# Patient Record
Sex: Female | Born: 1975 | Race: Black or African American | Hispanic: No | Marital: Single | Smoking: Current every day smoker
Health system: Southern US, Community
[De-identification: ages and names within clinical notes are randomized; demographics above are authoritative.]

## PROBLEM LIST (undated history)

## (undated) ENCOUNTER — Emergency Department (HOSPITAL_COMMUNITY)

## (undated) ENCOUNTER — Inpatient Hospital Stay (HOSPITAL_COMMUNITY): Payer: Self-pay

## (undated) DIAGNOSIS — T4145XA Adverse effect of unspecified anesthetic, initial encounter: Secondary | ICD-10-CM

## (undated) DIAGNOSIS — Z789 Other specified health status: Secondary | ICD-10-CM

## (undated) DIAGNOSIS — T8859XA Other complications of anesthesia, initial encounter: Secondary | ICD-10-CM

---

## 1997-05-26 ENCOUNTER — Emergency Department (HOSPITAL_COMMUNITY): Admission: EM | Admit: 1997-05-26 | Discharge: 1997-05-26 | Payer: Self-pay | Admitting: Emergency Medicine

## 2001-08-09 ENCOUNTER — Emergency Department (HOSPITAL_COMMUNITY): Admission: EM | Admit: 2001-08-09 | Discharge: 2001-08-09 | Payer: Self-pay | Admitting: Emergency Medicine

## 2002-03-17 ENCOUNTER — Ambulatory Visit (HOSPITAL_COMMUNITY): Admission: RE | Admit: 2002-03-17 | Discharge: 2002-03-17 | Payer: Self-pay | Admitting: *Deleted

## 2002-05-18 ENCOUNTER — Ambulatory Visit (HOSPITAL_COMMUNITY): Admission: RE | Admit: 2002-05-18 | Discharge: 2002-05-18 | Payer: Self-pay | Admitting: *Deleted

## 2002-06-25 ENCOUNTER — Ambulatory Visit (HOSPITAL_COMMUNITY): Admission: RE | Admit: 2002-06-25 | Discharge: 2002-06-25 | Payer: Self-pay | Admitting: *Deleted

## 2002-09-01 ENCOUNTER — Ambulatory Visit (HOSPITAL_COMMUNITY): Admission: RE | Admit: 2002-09-01 | Discharge: 2002-09-01 | Payer: Self-pay | Admitting: *Deleted

## 2002-09-21 ENCOUNTER — Encounter (INDEPENDENT_AMBULATORY_CARE_PROVIDER_SITE_OTHER): Payer: Self-pay | Admitting: Specialist

## 2002-09-21 ENCOUNTER — Inpatient Hospital Stay (HOSPITAL_COMMUNITY): Admission: AD | Admit: 2002-09-21 | Discharge: 2002-09-24 | Payer: Self-pay | Admitting: Family Medicine

## 2002-09-30 ENCOUNTER — Inpatient Hospital Stay (HOSPITAL_COMMUNITY): Admission: AD | Admit: 2002-09-30 | Discharge: 2002-09-30 | Payer: Self-pay | Admitting: Obstetrics and Gynecology

## 2005-03-15 ENCOUNTER — Emergency Department (HOSPITAL_COMMUNITY): Admission: EM | Admit: 2005-03-15 | Discharge: 2005-03-15 | Payer: Self-pay | Admitting: Emergency Medicine

## 2005-10-12 ENCOUNTER — Emergency Department (HOSPITAL_COMMUNITY): Admission: EM | Admit: 2005-10-12 | Discharge: 2005-10-12 | Payer: Self-pay | Admitting: Family Medicine

## 2005-12-05 ENCOUNTER — Inpatient Hospital Stay (HOSPITAL_COMMUNITY): Admission: AD | Admit: 2005-12-05 | Discharge: 2005-12-08 | Payer: Self-pay | Admitting: Obstetrics

## 2006-01-14 ENCOUNTER — Emergency Department (HOSPITAL_COMMUNITY): Admission: EM | Admit: 2006-01-14 | Discharge: 2006-01-14 | Payer: Self-pay | Admitting: Emergency Medicine

## 2006-06-25 ENCOUNTER — Emergency Department (HOSPITAL_COMMUNITY): Admission: EM | Admit: 2006-06-25 | Discharge: 2006-06-25 | Payer: Self-pay | Admitting: Emergency Medicine

## 2007-04-15 ENCOUNTER — Emergency Department (HOSPITAL_COMMUNITY): Admission: EM | Admit: 2007-04-15 | Discharge: 2007-04-15 | Payer: Self-pay | Admitting: Emergency Medicine

## 2007-06-04 ENCOUNTER — Inpatient Hospital Stay (HOSPITAL_COMMUNITY): Admission: AD | Admit: 2007-06-04 | Discharge: 2007-06-04 | Payer: Self-pay | Admitting: Obstetrics

## 2007-06-23 ENCOUNTER — Inpatient Hospital Stay (HOSPITAL_COMMUNITY): Admission: AD | Admit: 2007-06-23 | Discharge: 2007-06-24 | Payer: Self-pay | Admitting: Obstetrics

## 2007-09-02 ENCOUNTER — Inpatient Hospital Stay (HOSPITAL_COMMUNITY): Admission: RE | Admit: 2007-09-02 | Discharge: 2007-09-02 | Payer: Self-pay | Admitting: Gynecology

## 2007-09-30 ENCOUNTER — Inpatient Hospital Stay (HOSPITAL_COMMUNITY): Admission: RE | Admit: 2007-09-30 | Discharge: 2007-10-02 | Payer: Self-pay | Admitting: Obstetrics

## 2007-09-30 ENCOUNTER — Encounter (INDEPENDENT_AMBULATORY_CARE_PROVIDER_SITE_OTHER): Payer: Self-pay | Admitting: Obstetrics

## 2007-10-14 ENCOUNTER — Inpatient Hospital Stay (HOSPITAL_COMMUNITY): Admission: AD | Admit: 2007-10-14 | Discharge: 2007-10-17 | Payer: Self-pay | Admitting: Obstetrics

## 2008-01-18 ENCOUNTER — Inpatient Hospital Stay (HOSPITAL_COMMUNITY): Admission: AD | Admit: 2008-01-18 | Discharge: 2008-01-18 | Payer: Self-pay | Admitting: Obstetrics & Gynecology

## 2009-11-14 ENCOUNTER — Emergency Department (HOSPITAL_COMMUNITY): Admission: EM | Admit: 2009-11-14 | Discharge: 2009-11-14 | Payer: Self-pay | Admitting: Emergency Medicine

## 2010-04-05 LAB — COMPREHENSIVE METABOLIC PANEL
AST: 27 U/L (ref 0–37)
Albumin: 3.8 g/dL (ref 3.5–5.2)
Alkaline Phosphatase: 43 U/L (ref 39–117)
Chloride: 107 mEq/L (ref 96–112)
Creatinine, Ser: 0.85 mg/dL (ref 0.4–1.2)
GFR calc Af Amer: >60 mL/min (ref >60)
Potassium: 3.9 mEq/L (ref 3.5–5.1)
Total Bilirubin: 0.5 mg/dL (ref 0.3–1.2)

## 2010-04-05 LAB — DIFFERENTIAL
Basophils Absolute: 0 10*3/uL (ref 0.0–0.1)
Eosinophils Relative: 1 % (ref 0–5)
Lymphocytes Relative: 29 % (ref 12–46)
Monocytes Absolute: 0.5 10*3/uL (ref 0.1–1.0)
Monocytes Relative: 7 % (ref 3–12)

## 2010-04-05 LAB — URINALYSIS, ROUTINE W REFLEX MICROSCOPIC
Nitrite: NEGATIVE
Specific Gravity, Urine: 1.016 (ref 1.005–1.030)
Urobilinogen, UA: 1 mg/dL (ref 0.0–1.0)

## 2010-04-05 LAB — PREGNANCY, URINE: Preg Test, Ur: NEGATIVE

## 2010-04-05 LAB — CBC
Platelets: 192 10*3/uL (ref 150–400)
RBC: 3.53 MIL/uL — ABNORMAL LOW (ref 3.87–5.11)
WBC: 7.9 10*3/uL (ref 4.0–10.5)

## 2010-06-06 NOTE — Op Note (Signed)
NAMESHERESA, CULLOP        ACCOUNT NO.:  [id number]   MEDICAL RECORD NO.:  [id number]          PATIENT TYPE:  INP   LOCATION:  9103                          [facility]ILITY:  [facility]   PHYSICIAN:  Kathreen Cosier, M.D.DATE OF BIRTH:  Jun 08, 1975   DATE OF PROCEDURE:  09/30/2007  DATE OF DISCHARGE:                               OPERATIVE REPORT   PREOPERATIVE DIAGNOSIS:  Intrauterine pregnancy 37+ weeks with a  previously dehisced classical uterine scar.   POSTOPERATIVE DIAGNOSIS:  Intrauterine pregnancy 37+ weeks with a  previously dehisced classical uterine scar.   SURGEON:  Kathreen Cosier, MD   FIRST ASSISTANT:  Charles A. Clearance Coots, MD   ANESTHESIA:  Spinal.   PROCEDURE:  The patient placed on the operating table in supine position  after spinal administered.  Abdomen prepped and draped, bladder emptied  with a Foley catheter.  Midline incision made through the old scar,  carried down to the rectus fascia.  Fascia was cleaned and incised to  the length of the incision.  Recti muscles retracted laterally and then  opening the peritoneum, the placenta could be palpated.  The abdominal  wall incision was opened and it was noted that the previous classical  incision on the uterus was open.  The membranes were visible and the  anterior fundal placenta visible.  Membranes were ruptured.  The patient  delivered from the vertex position of a female Apgar 8 and 9, weighing 6  pounds and 9 ounces.  The placenta was removed manually.  Uterus cleaned  with dry laps.  It was noted that the abdominal wall was thick and  intimately adherent to the anterior abdominal wall.  The anterior  abdominal wall had been acting as anterior wall of the uterus.  The  uterine incision was closed in 2 layers with continuous suture of #1  chromic.  Hemostasis was satisfactory.  The abdominal wall was closed  with continuous suture of #1 PDS.  Blood loss was 600 mL.  The skin was  closed with staples.  The  patient tolerated the procedure well, taken to  recovery room in good condition.           ______________________________  Kathreen Cosier, M.D.     BAM/MEDQ  D:  09/30/2007  T:  10/01/2007  Job:  [id number]

## 2010-06-09 NOTE — Op Note (Signed)
NAMEREAGYN, FACEMIRE        ACCOUNT NO.:  [id number]   MEDICAL RECORD NO.:  [id number]          PATIENT TYPE:  INP   LOCATION:  9130                          [facility]   PHYSICIAN:  Kathreen Cosier, M.D.DATE OF BIRTH:  05-12-1975   DATE OF PROCEDURE:  12/05/2005  DATE OF DISCHARGE:                                 OPERATIVE REPORT   PREOPERATIVE DIAGNOSIS:  Previous cesarean section x2 at term, for elective  repeat.   SURGEON:  Kathreen Cosier, M.D.   FIRST ASSISTANT:  Charles A. Clearance Coots, M.D.   ANESTHESIA:  Spinal.   DESCRIPTION OF PROCEDURE:  The patient placed on the operating table in the  supine position.  After spinal was administered, abdomen prepped and draped.  Bladder emptied with Foley catheter.  Midline incision made through the old  scar, carried down through the rectus fascia.  The fascia was cleaned and  incised the length of the incision.  Rectus muscle were retracted laterally.  Starting at the top of the incision, the per was grasped with two Kellys and  hemostasis was used to open the peritoneum at the top of the incision.  At  that point, the patient started having heavy bleeding and the incision was  opened with a scalpel and there was a large amount of blood present.  She  had had a previous classical incision and the incision was dehisced totally.  The placenta was presenting and this was separated and the membranes  ruptured and fluid clear. She delivered vertex female, Apgars 9 and 9, weight  7 pounds 1 ounce.  The placenta was removed and the uterine cavity was  cleaned with dry laps.  The classical incision was closed in two layers with  interrupted suture of 3-#1 chromic.  Hemostasis was satisfactory.  The  fascia was then closed with interrupted suture of 0 PDS and the skin closed  with skin clips.  Blood loss 700 mL.  The patient tolerated the procedure  well and went to the recovery room in good condition.     ______________________________  Kathreen Cosier, M.D.     BAM/MEDQ  D:  12/05/2005  T:  12/05/2005  Job:  [id number]

## 2010-06-09 NOTE — Discharge Summary (Signed)
April Knight, WEATHERHOLTZ        ACCOUNT NO.:  [id number]   MEDICAL RECORD NO.:  [id number]          PATIENT TYPE:  INP   LOCATION:  9319                          [facility]   PHYSICIAN:  Roseanna Rainbow, M.D.DATE OF BIRTH:  29-May-1975   DATE OF ADMISSION:  10/14/2007  DATE OF DISCHARGE:  10/17/2007                               DISCHARGE SUMMARY   CHIEF COMPLAINT:  The patient is a 35 year old status post a cesarean  delivery on September 30, 2007, complaining of a headache.   HISTORY OF PRESENT ILLNESS:  Please see the above.   PHYSICAL EXAMINATION:  VITAL SIGNS:  Blood pressure 160/102.  HEAD, EYES, EARS, NOSE and THROAT:  Normocephalic, atraumatic.  LUNGS:  Clear to auscultation bilaterally.  BREASTS:  Engorged.  HEART:  Regular rate and rhythm.  No murmurs, rubs, or gallops.  ABDOMEN:  Midline scar with staples.  EXTREMITIES:  Nontender.   LABORATORY DATA:  Uric acid 8.3, SGPT 38.   ASSESSMENT:  Postpartum pregnancy-induced hypertension.   PLAN:  Admission, magnesium sulfate seizure prophylaxis.   HOSPITAL COURSE:  The patient was admitted.  She was started on  magnesium sulfate for seizure prophylaxis.  She was also started on p.o.  labetalol.  The magnesium sulfate was continued for 24 hours.  The  patient's headache resolved.  The remainder of her hospital course was  uneventful.  She was discharged to home on October 17, 2007.   DISCHARGE DIAGNOSIS:  Postpartum pregnancy-induced hypertension.   CONDITION:  Stable.   DIET:  Regular.   ACTIVITY:  Modified bed rest.   DISPOSITION:  The patient was to follow up in the office with Dr.  Gaynell Face in 1-week.   MEDICATIONS:  Continue Percocet and ibuprofen.  She was also given a  prescription for labetalol 1 tablet t.i.d.      Roseanna Rainbow, M.D.  Electronically Signed     LAJ/MEDQ  D:  03/17/2008  T:  03/18/2008  Job:  [id number]   cc:   Kathreen Cosier, M.D.  Fax: [phone]

## 2010-06-09 NOTE — Op Note (Signed)
NAME:  LADONNE, SHARPLES                  ACCOUNT NO.:  [id number]   MEDICAL RECORD NO.:  [id number]                   PATIENT TYPE:  INP   LOCATION:  9119                                 [facility]   PHYSICIAN:  Tanya S. Shawnie Pons, M.D.                DATE OF BIRTH:  [birth year]   DATE OF PROCEDURE:  09/21/2002  DATE OF DISCHARGE:                                 OPERATIVE REPORT   PREOPERATIVE DIAGNOSES:  1. Intrauterine pregnancy at 39 weeks.  2. Left ovarian cyst.  3. Previous cesarean section.   POSTOPERATIVE DIAGNOSES:  1. Intrauterine pregnancy at 39 weeks.  2. Left ovarian cyst.  3. Previous cesarean section.   PROCEDURES:  1. Repeat low transverse cesarean section.  2. Left ovarian cystectomy.  3. Scar revision.  4. Adhesiolysis.   SURGEON:  Shelbie Proctor. Shawnie Pons, M.D.   ASSISTANTMichele Mcalpine D. Okey Dupre, M.D.   ANESTHESIA:  Spinal and sedation by Burnett Corrente, M.D.   FINDINGS:  A viable female infant, Apgars of 9 and 9, weight of 7 pounds 6  ounces.   ESTIMATED BLOOD LOSS:  1000 mL.   COMPLICATIONS:  None.   SPECIMENS:  Ovarian cyst to pathology.   REASON FOR PROCEDURE:  Briefly, the patient is a 35 year old gravida 3, para  1-0-1-1, who is at 51 weeks, who had a previous C-section at 41 weeks for  persistent OP and arrest of descent.  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  The patient also has a history of previous right oophorectomy as a child for  unknown reason.  The patient desired a repeat C-section with ovarian cyst  removal.   DESCRIPTION OF PROCEDURE:  The patient was taken to the OR, where she was  placed in the supine position with a left lateral tilt after spinal  analgesia was administered.  She was then prepped and draped in the usual  sterile fashion, a Foley catheter placed in the bladder.  When anesthesia  was found to be adequate, her vertical midline incision was  incised.  The  scar was fairly wide and elliptical incision was made to excise the scar.  The electrocautery was used to completely dissect the scar out of the  operative field.  When the scar was removed, the fascia was then entered  sharply and the incision extended superiorly and inferiorly.  The midline of  the incision was found and the rectus muscle divided.  The peritoneum and  uterus were found to be strongly adherent to the anterior abdominal wall,  especially around the fundus.  The peritoneum was entered sharply and then  the bladder blade placed.  The peritoneal incision had to be extended  laterally in both directions as well as superiorly until enough room was  made for passage of the infant.  At a point above  the bladder reflection a  knife was used to make an incision on the uterus in a transverse fashion.  This segment, although low transverse, was not thinned out at all and was  very thick.  Two Allis clamps were used in the middle of the incision to  control bleeding until the amniotic cavity could be encountered.  The bag  was ruptured and found to have clear fluid.  The incision in the uterus was  then extended laterally with the bandage scissors and the infant found to be  in vertex position and OP.  The infant's head was directed to OA and with  fundal pressure, the infant's head was delivered.  The baby was bulb-  suctioned, the rest of the body delivered, the cord clamped x2 and the  infant handed to the waiting pediatricians.  There was a spontaneous cry  and, as stated previously, the Apgars were 9 and 9.  The weight was 7 pounds  6 ounces.  Cord blood was then obtained and the placenta delivered from the  uterus without problem.  The uterine cavity was then cleaned with a dry lap  pad and a ring forceps used to open the cervix.  The other rings were used  to find the borders of the uterine incision, clamped, and then the uterine  incision was closed with a #1  Vicryl suture in a locked running fashion.  A  second imbricating layer of a Lembert stitch was then used for further  hemostasis.  The uterine incision appeared to be hemostatic and attention  was turned to the left ovary.  Initially the round was grasped with a  Babcock clamp and attempt was made to deliver the ovary, but this could not  be done.  Omentum was in the way, and a lap pad was used to pack the omentum  away on the left gutter.  Still the ovary could not be brought out, and so  attempt was made to free up the adhesions to the uterine fundus.  Several  layers of peritoneum were taken down and appeared to be grossly adherent to  the uterine wall, and it seemed that muscle was the most thing being  divided.  This was done on the right side first and then on the left side  superiorly until the left ovary was able to be delivered.  A large cyst was  noted on the ovary.  An incision was made with the electrocautery through  the capsule until the cyst could be identified and then was dissected out.  The cyst did rupture and then there was lipid-like fluid spillage and the  signs were consistent with a dermoid.  Once the entire cyst was removed, the  defect in the ovary was closed with a 3-0 Vicryl suture on an SH in two  layers.  The ovary was hemostatic and returned to the peritoneal cavity.  The tube was looked at and found to be also hemostatic.  The right adnexa  was looked at and hemostatic.  The abdomen was then copiously irrigated with  warm fluid and the uterine incision looked at again and found to be  hemostatic.  The only areas of bleeding were noted from the adhesiolysis  sites on the uterine fundus and the peritoneum that had been adjacent to it.  These areas were cauterized with the electrocautery and when hemostatic,  returned to the abdomen.  The superior and inferior edge of the fascia were  then grasped with two  Kocher clamps and the fascia closed in a  running fashion with a looped PDS starting from the top of the incision and then  from the inferior portion of the incision.  Because of the distorted  anatomy, only the fascia was closed with a stitch.  The two sutures were  brought to the midline and tied in the middle.  The subcutaneous tissue was  copiously irrigated and then the skin was closed using staples.  Once the  skin was closed, the incision was infiltrated with 17 mL of 0.25% Marcaine  plain.  The patient tolerated the procedure well.  All instrument, needle,  and lap counts were correct x2.  The patient was awakened and taken to the  recovery room in stable condition.                                               Shelbie Proctor. Shawnie Pons, M.D.    TSP/MEDQ  D:  09/21/2002  T:  09/21/2002  Job:  [id number]

## 2010-06-09 NOTE — Discharge Summary (Signed)
NAMEMarland Kitchen  Knight, April        ACCOUNT NO.:  [id number]   MEDICAL RECORD NO.:  [id number]          PATIENT TYPE:  INP   LOCATION:  9103                          [facility]   PHYSICIAN:  Kathreen Cosier, M.D.DATE OF BIRTH:  October 21, 1975   DATE OF ADMISSION:  09/30/2007  DATE OF DISCHARGE:  10/02/2007                               DISCHARGE SUMMARY   The patient is a 35 year old gravida 4, para 3-0-0-3 who had 3 previous  C-sections.  Her EDC was October 17, 2007, and her last C-section also  was a classical with a total dehiscence of her old classical scar.  She  was followed by [facility] and recommended repeat delivery.  At this stage,  the patient refused tubal ligation.  At the time of C-section when the  fascia was opened, it was noted that the placenta was presenting to the  totally dehisced classical scar.  The membranes were artificially  ruptured and she had a female, Apgar 8 and 9, weighing 6 pounds 9 ounces.  Placenta was anterior fundal.  The edges of the uterus were  intermittently adherent to the anterior abdominal wall.  Placenta was  removed manually and the uterus closed in 2 layers with continuous #1  chromic.  The fascia was closed with #1 PDS, and skin with staples.  Blood loss 600 mL.  The patient had been advised in 2007, not to get  pregnant again and ignored the advice and once again got pregnant.  Postop she did well.  Hemoglobin 8.8.  She wanted early discharge and  was discharged on the second postoperative day on October 02, 2007, to  see me in 6 weeks.   DISCHARGE DIAGNOSIS:  Status post repeat classical cesarean section at  37 weeks.           ______________________________  Kathreen Cosier, M.D.     BAM/MEDQ  D:  12/10/2007  T:  12/10/2007  Job:  [id number]

## 2010-06-09 NOTE — Discharge Summary (Signed)
NAMEMarland Kitchen  April, Knight        ACCOUNT NO.:  [id number]   MEDICAL RECORD NO.:  [id number]          PATIENT TYPE:  INP   LOCATION:  9130                          [facility]   PHYSICIAN:  Kathreen Cosier, M.D.DATE OF BIRTH:  [birth year]   DATE OF ADMISSION:  12/05/2005  DATE OF DISCHARGE:  12/08/2005                               DISCHARGE SUMMARY   The patient is a 35 year old gravida 3, para 2-0-0-2, who had two  previous C-sections.  Her EDC was November 19, and she was in for a  repeat C-section.  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 daily, no outbreaks.  On admission her hemoglobin  was 11.5, postop 7.8, white count 14.2/12.1, 217/160.  PT and PTT  normal.  Sodium 135, potassium 3.6, chloride 103, creatinine 0.6.  Urine  negative.  On November 14 the patient underwent repeat low transverse  cesarean section.  She had a midline incision and on entering the  peritoneal cavity, she had heavy bleeding coming through the incision  and when the incision was opened, it was noted that her uterine scar had  dehisced in a vertical manner, suggesting a previous classical uterine  scar, and she had had an anterior placenta presenting.  Membranes were  artificially ruptured and she delivered a female, Apgar 9/9, weighing 7  pounds 1 ounce.  Review of her old records revealed that she had had a  high transverse uterine incision with her previous C-section.  Her  uterus was closed in two layers and blood loss was approximately 700 mL.  Postop she did well.  Her hemoglobin was 7.8.  She was asymptomatic, and  she was started on ferrous sulfate 325 mg p.o. daily.  She was  discharged on the third postoperative day ambulatory, on a regular diet.  Tylox for pain and ferrous sulfate for anemia.   DISCHARGE DIAGNOSIS:  Status post previous repeat low transverse  cesarean section, which also revealed a dehisced uterine scar.     ______________________________  Kathreen Cosier, M.D.     BAM/MEDQ  D:  12/26/2005  T:  12/26/2005  Job:  [id number]

## 2010-06-09 NOTE — Discharge Summary (Signed)
NAME:  OMIE, FERGER                  ACCOUNT NO.:  [id number]   MEDICAL RECORD NO.:  [id number]                   PATIENT TYPE:  INP   LOCATION:  9119                                 [facility]   PHYSICIAN:  Tanya S. Shawnie Pons, M.D.                DATE OF BIRTH:  [birth year]   DATE OF ADMISSION:  09/21/2002  DATE OF DISCHARGE:  09/24/2002                                 DISCHARGE SUMMARY   PRIMARY CARE PHYSICIAN:  [facility]   REFERRING PHYSICIAN:  None.   CONSULTING PHYSICIAN:  None.   FINAL DIAGNOSES:  1. Intrauterine pregnancy at 39 weeks.  2. Ovarian cyst.  3. Status post repeat lower transverse cesarean section.  4. Status post ovarian cystectomy.  5. Status post scar revision.   PRINCIPAL PROCEDURE:  1. Repeat lower transverse cesarean section.  This was performed by Shelbie Proctor.     Shawnie Pons, M.D.  Please see chart for further details.  This was performed.     The abdominal incision was vertical.  However, the uterine incision was     transverse.  2. Left ovarian cystectomy performed by Shelbie Proctor. Shawnie Pons, M.D.  Please see     chart for further details.  3. Scar revision performed by Shelbie Proctor. Shawnie Pons, M.D.  Please see chart for     further details.   HISTORY AND PHYSICAL:  Please see chart.   LABORATORY DATA:  At time of discharge patient's hematocrit and hemoglobin  were 30.9/10.6.   HOSPITAL COURSE:  Problem 1 - The patient was admitted for a repeat lower  transverse cesarean section and left ovarian cystectomy.  This procedure was  performed by Shelbie Proctor. Shawnie Pons, M.D. and with Javier Glazier. Okey Dupre, M.D. assisting.  The procedure was performed under spinal epidural and mild sedation.  The  patient tolerated the procedure well.  Procedure went fine without any  complications and she had a viable baby boy.  She desires to bottle feed the  baby and requests Depo-Provera for contraception.  Her postpartum course was  uncomplicated.  On the date of discharge the  patient still had staples in  her vertical incision and was instructed to return to the MAU seven days  after leaving the hospital for their removal.  The remainder of her hospital  course was uncomplicated and the patient was in good condition upon  discharge.  Problem 2 - Instruction to patient and family.  The patient as instructed to  return to the MAU seven days post discharge for removal of her staples.  She  was instructed to shower with water hitting only her back and not allow  water to hit directly upon the wound.  The patient was instructed to not  have vaginal intercourse for approximately six weeks and was encouraged to  not become pregnant for six months.   DISCHARGE MEDICATIONS:  1. Percocet 5/325 one tablet p.o. q.4h. p.r.n. pain, #30.  2.  Ibuprofen 600 mg one tablet p.o. q.6h. p.r.n. pain, #30.  3. Prenatal vitamins one tablet p.o. daily.  4. Depo-Provera 150 mg IM x1 q.3 months.   ADVANCED DIRECTIVES:  None.   DNR STATUS:  None.     Penni Bombard, MD                          Shelbie Proctor. Shawnie Pons, M.D.    SJ/MEDQ  D:  09/24/2002  T:  09/25/2002  Job:  [id number]

## 2010-10-23 LAB — CBC
HCT: 32.9 — ABNORMAL LOW
Hemoglobin: 10.8 — ABNORMAL LOW
MCHC: 32.8
MCV: 93.8
RBC: 3.51 — ABNORMAL LOW

## 2010-10-23 LAB — URINALYSIS, ROUTINE W REFLEX MICROSCOPIC
Bilirubin Urine: NEGATIVE
Glucose, UA: NEGATIVE
Hgb urine dipstick: NEGATIVE
Specific Gravity, Urine: <1.005 — ABNORMAL LOW
Urobilinogen, UA: 0.2
pH: 6.5

## 2010-10-23 LAB — COMPREHENSIVE METABOLIC PANEL
BUN: 8
CO2: 27
Calcium: 8.9
Chloride: 105
Creatinine, Ser: 0.85
GFR calc non Af Amer: >60
Glucose, Bld: 95
Total Bilirubin: 0.3

## 2010-10-23 LAB — LACTATE DEHYDROGENASE: LDH: 298 — ABNORMAL HIGH

## 2010-10-25 LAB — TYPE AND SCREEN: ABO/RH(D): O POS

## 2010-10-25 LAB — CBC
HCT: 26.5 — ABNORMAL LOW
Hemoglobin: 8.8 — ABNORMAL LOW
MCHC: 33.2
Platelets: 188
Platelets: 208
RBC: 2.97 — ABNORMAL LOW
RDW: 12.5
WBC: 8.8

## 2010-10-25 LAB — RPR: RPR Ser Ql: NONREACTIVE

## 2010-10-25 LAB — ABO/RH: ABO/RH(D): O POS

## 2010-10-26 LAB — URINE MICROSCOPIC-ADD ON

## 2010-10-26 LAB — WET PREP, GENITAL
Trich, Wet Prep: NONE SEEN
Yeast Wet Prep HPF POC: NONE SEEN

## 2010-10-26 LAB — URINE CULTURE

## 2010-10-26 LAB — CBC
HCT: 36 % (ref 36.0–46.0)
Hemoglobin: 12.1 g/dL (ref 12.0–15.0)
MCV: 91.4 fL (ref 78.0–100.0)
WBC: 5.7 10*3/uL (ref 4.0–10.5)

## 2010-10-26 LAB — URINALYSIS, ROUTINE W REFLEX MICROSCOPIC
Nitrite: NEGATIVE
Protein, ur: 100 mg/dL — AB
Specific Gravity, Urine: >1.03 — ABNORMAL HIGH (ref 1.005–1.030)
Urobilinogen, UA: 0.2 mg/dL (ref 0.0–1.0)

## 2010-10-26 LAB — POCT PREGNANCY, URINE: Preg Test, Ur: NEGATIVE

## 2013-01-01 ENCOUNTER — Encounter (HOSPITAL_COMMUNITY): Payer: Self-pay | Admitting: *Deleted

## 2013-01-01 ENCOUNTER — Inpatient Hospital Stay (HOSPITAL_COMMUNITY)
Admission: AD | Admit: 2013-01-01 | Discharge: 2013-01-01 | Disposition: A | Discharge status: Home / Self Care | Payer: Self-pay | Source: Ambulatory Visit | Attending: Obstetrics & Gynecology | Admitting: Obstetrics & Gynecology

## 2013-01-01 DIAGNOSIS — O34219 Maternal care for unspecified type scar from previous cesarean delivery: Secondary | ICD-10-CM | POA: Insufficient documentation

## 2013-01-01 DIAGNOSIS — O210 Mild hyperemesis gravidarum: Secondary | ICD-10-CM | POA: Insufficient documentation

## 2013-01-01 DIAGNOSIS — A084 Viral intestinal infection, unspecified: Secondary | ICD-10-CM

## 2013-01-01 DIAGNOSIS — R197 Diarrhea, unspecified: Secondary | ICD-10-CM | POA: Insufficient documentation

## 2013-01-01 DIAGNOSIS — R109 Unspecified abdominal pain: Secondary | ICD-10-CM | POA: Insufficient documentation

## 2013-01-01 DIAGNOSIS — A088 Other specified intestinal infections: Secondary | ICD-10-CM

## 2013-01-01 HISTORY — DX: Other specified health status: Z78.9

## 2013-01-01 HISTORY — DX: Adverse effect of unspecified anesthetic, initial encounter: T41.45XA

## 2013-01-01 HISTORY — DX: Other complications of anesthesia, initial encounter: T88.59XA

## 2013-01-01 LAB — URINE MICROSCOPIC-ADD ON

## 2013-01-01 LAB — URINALYSIS, ROUTINE W REFLEX MICROSCOPIC
Bilirubin Urine: NEGATIVE
Glucose, UA: NEGATIVE mg/dL
Ketones, ur: NEGATIVE mg/dL
Nitrite: NEGATIVE
Specific Gravity, Urine: <1.005 — ABNORMAL LOW (ref 1.005–1.030)
pH: 6.5 (ref 5.0–8.0)

## 2013-01-01 MED ORDER — ONDANSETRON HCL 4 MG PO TABS
8.0000 mg | ORAL_TABLET | Freq: Once | ORAL | Status: AC
  Administered 2013-01-01: 8 mg via ORAL
  Filled 2013-01-01: qty 2

## 2013-01-01 MED ORDER — ONDANSETRON HCL 8 MG PO TABS: 8.0000 mg | ORAL_TABLET | Freq: Three times a day (TID) | ORAL | Status: DC | PRN

## 2013-01-01 NOTE — MAU Provider Note (Signed)
History     CSN: [id number]  Arrival date and time: 01/01/13 2045   First Provider Initiated Contact with Patient 01/01/13 2104      Chief Complaint  Patient presents with  . Nausea  . Emesis  . Abdominal Pain   HPI This is a 37 y.o. female at [redacted]w[redacted]d who presents with c/o Vomiting and diarrhea since yesterday.   All of her children have had this also this week. No fever. + malaise. [facility] Last delivery by Dr Gaynell Face.   NOTE:  Has had 4 C/S.  Last TWO were Classical and BOTH had uterine scar dehiscence noted during the surgery. 4th C/S, the placenta was anterior, visible through dehisced uterus.   RN Note: Pt reports vomiting and diarrhea today. Denies fever. Lower abd pain.       OB History   Grav Para Term Preterm Abortions TAB SAB Ect Mult Living   6 4 4  1 1    4       Past Medical History  Diagnosis Date  . Complication of anesthesia   . Medical history non-contributory     Past Surgical History  Procedure Laterality Date  . Cesarean section      History reviewed. No pertinent family history.  History  Substance Use Topics  . Smoking status: Never Smoker   . Smokeless tobacco: Not on file  . Alcohol Use: No    Allergies: No Known Allergies  No prescriptions prior to admission    Review of Systems  Constitutional: Positive for malaise/fatigue. Negative for fever and chills.  Gastrointestinal: Positive for nausea, vomiting and diarrhea. Negative for abdominal pain and constipation.  Genitourinary: Negative for dysuria.  Neurological: Negative for dizziness and headaches.   Physical Exam   Blood pressure 104/67, pulse 72, temperature 98.6 F (37 C), temperature source Oral, resp. rate 18, height 5' 5.5" (1.664 m), weight 177 lb (80.287 kg), SpO2 100.00%.  Physical Exam  Constitutional: She is oriented to person, place, and time. She appears well-developed. No distress.  HENT:  Head:  Normocephalic.  Cardiovascular: Normal rate.   Respiratory: Effort normal.  GI: Soft. She exhibits no distension. There is no tenderness. There is no rebound and no guarding.  Healed vertical abdominal scar   Musculoskeletal: Normal range of motion.  Neurological: She is alert and oriented to person, place, and time.  Skin: Skin is warm and dry.  Psychiatric: She has a normal mood and affect.    MAU Course  Procedures  MDM Results for orders placed during the hospital encounter of 01/01/13 (from the past 24 hour(s))  URINALYSIS, ROUTINE W REFLEX MICROSCOPIC     Status: Abnormal   Collection Time    01/01/13  8:57 PM      Result Value Range   Color, Urine YELLOW  YELLOW   APPearance CLEAR  CLEAR   Specific Gravity, Urine <1.005 (*) 1.005 - 1.030   pH 6.5  5.0 - 8.0   Glucose, UA NEGATIVE  NEGATIVE mg/dL   Hgb urine dipstick TRACE (*) NEGATIVE   Bilirubin Urine NEGATIVE  NEGATIVE   Ketones, ur NEGATIVE  NEGATIVE mg/dL   Protein, ur NEGATIVE  NEGATIVE mg/dL   Urobilinogen, UA 0.2  0.0 - 1.0 mg/dL   Nitrite NEGATIVE  NEGATIVE   Leukocytes, UA NEGATIVE  NEGATIVE  URINE MICROSCOPIC-ADD ON     Status: None   Collection Time    01/01/13  8:57 PM  Result Value Range   Squamous Epithelial / LPF RARE  RARE   WBC, UA 0-2  <3 WBC/hpf   Bacteria, UA RARE  RARE   Able to keep PO fluids down here  Assessment and Plan  A:  SIUP at [redacted]w[redacted]d      Probable viral gastroenteritis with known family contacts      Previous C/S x 4      Hx Uterine rupture x 2  P:  Discharge home       Has Zofran at home       Supportive care, BRAT diet       [facility]        May need to transfer to HR clinic  [facility] 01/01/2013, 9:14 PM

## 2013-01-01 NOTE — MAU Note (Signed)
Pt reports vomiting and diarrhea today. Denies fever. Lower abd pain.

## 2013-01-02 DIAGNOSIS — O34219 Maternal care for unspecified type scar from previous cesarean delivery: Secondary | ICD-10-CM | POA: Diagnosis present

## 2013-01-02 NOTE — MAU Provider Note (Signed)
Attestation of Attending Supervision of Advanced Practitioner (CNM/NP): Evaluation and management procedures were performed by the Advanced Practitioner under my supervision and collaboration. I have reviewed the Advanced Practitioner's note and chart, and I agree with the management and plan.  Demarco Bacci H. 7:35 AM   

## 2013-01-12 ENCOUNTER — Other Ambulatory Visit (HOSPITAL_COMMUNITY): Payer: Self-pay | Admitting: Nurse Practitioner

## 2013-01-12 DIAGNOSIS — O09522 Supervision of elderly multigravida, second trimester: Secondary | ICD-10-CM

## 2013-01-12 DIAGNOSIS — Z3689 Encounter for other specified antenatal screening: Secondary | ICD-10-CM

## 2013-01-12 LAB — OB RESULTS CONSOLE PLATELET COUNT: PLATELETS: 266 10*3/uL

## 2013-01-12 LAB — OB RESULTS CONSOLE ANTIBODY SCREEN: ANTIBODY SCREEN: NEGATIVE

## 2013-01-12 LAB — GLUCOSE, 1 HOUR: Glucose, 1 hour: 90

## 2013-01-12 LAB — OB RESULTS CONSOLE GC/CHLAMYDIA
CHLAMYDIA, DNA PROBE: NEGATIVE
Gonorrhea: NEGATIVE

## 2013-01-12 LAB — OB RESULTS CONSOLE HGB/HCT, BLOOD
HCT: 32 %
HEMOGLOBIN: 10.6 g/dL

## 2013-01-12 LAB — OB RESULTS CONSOLE HIV ANTIBODY (ROUTINE TESTING): HIV: NONREACTIVE

## 2013-01-12 LAB — CYTOLOGY - PAP: Pap Smear: NEGATIVE

## 2013-01-12 LAB — OB RESULTS CONSOLE RUBELLA ANTIBODY, IGM: Rubella: IMMUNE

## 2013-01-12 LAB — CYSTIC FIBROSIS DIAGNOSTIC STUDY: INTERPRETATION-CFDNA: NEGATIVE

## 2013-01-12 LAB — OB RESULTS CONSOLE RPR: RPR: NONREACTIVE

## 2013-01-12 LAB — OB RESULTS CONSOLE VARICELLA ZOSTER ANTIBODY, IGG: VARICELLA IGG: IMMUNE

## 2013-01-12 LAB — OB RESULTS CONSOLE ABO/RH: RH Type: POSITIVE

## 2013-01-12 LAB — OB RESULTS CONSOLE HEPATITIS B SURFACE ANTIGEN: Hepatitis B Surface Ag: NEGATIVE

## 2013-01-19 ENCOUNTER — Ambulatory Visit (HOSPITAL_COMMUNITY)
Admission: RE | Admit: 2013-01-19 | Discharge: 2013-01-19 | Disposition: A | Discharge status: Home / Self Care | Payer: Medicaid Other | Source: Ambulatory Visit | Attending: Nurse Practitioner | Admitting: Nurse Practitioner

## 2013-01-19 DIAGNOSIS — Z363 Encounter for antenatal screening for malformations: Secondary | ICD-10-CM | POA: Insufficient documentation

## 2013-01-19 DIAGNOSIS — O358XX0 Maternal care for other (suspected) fetal abnormality and damage, not applicable or unspecified: Secondary | ICD-10-CM | POA: Insufficient documentation

## 2013-01-19 DIAGNOSIS — Z1389 Encounter for screening for other disorder: Secondary | ICD-10-CM | POA: Insufficient documentation

## 2013-01-19 DIAGNOSIS — Z3689 Encounter for other specified antenatal screening: Secondary | ICD-10-CM

## 2013-01-19 DIAGNOSIS — O34219 Maternal care for unspecified type scar from previous cesarean delivery: Secondary | ICD-10-CM | POA: Insufficient documentation

## 2013-01-19 DIAGNOSIS — O09529 Supervision of elderly multigravida, unspecified trimester: Secondary | ICD-10-CM | POA: Insufficient documentation

## 2013-01-19 DIAGNOSIS — O09522 Supervision of elderly multigravida, second trimester: Secondary | ICD-10-CM

## 2013-01-26 ENCOUNTER — Other Ambulatory Visit (HOSPITAL_COMMUNITY): Payer: Self-pay | Admitting: Nurse Practitioner

## 2013-01-26 DIAGNOSIS — IMO0002 Reserved for concepts with insufficient information to code with codable children: Secondary | ICD-10-CM

## 2013-01-26 DIAGNOSIS — Z0489 Encounter for examination and observation for other specified reasons: Secondary | ICD-10-CM

## 2013-02-16 ENCOUNTER — Ambulatory Visit (HOSPITAL_COMMUNITY)
Admission: RE | Admit: 2013-02-16 | Discharge: 2013-02-16 | Disposition: A | Discharge status: Home / Self Care | Payer: Medicaid Other | Source: Ambulatory Visit | Attending: Nurse Practitioner | Admitting: Nurse Practitioner

## 2013-02-16 DIAGNOSIS — IMO0002 Reserved for concepts with insufficient information to code with codable children: Secondary | ICD-10-CM

## 2013-02-16 DIAGNOSIS — Z0489 Encounter for examination and observation for other specified reasons: Secondary | ICD-10-CM

## 2013-02-16 DIAGNOSIS — Z3689 Encounter for other specified antenatal screening: Secondary | ICD-10-CM | POA: Insufficient documentation

## 2013-02-16 DIAGNOSIS — O34219 Maternal care for unspecified type scar from previous cesarean delivery: Secondary | ICD-10-CM | POA: Insufficient documentation

## 2013-02-16 DIAGNOSIS — O09529 Supervision of elderly multigravida, unspecified trimester: Secondary | ICD-10-CM | POA: Insufficient documentation

## 2013-02-17 ENCOUNTER — Encounter (HOSPITAL_COMMUNITY): Payer: Self-pay

## 2013-02-17 ENCOUNTER — Other Ambulatory Visit (HOSPITAL_COMMUNITY): Payer: Self-pay | Admitting: Nurse Practitioner

## 2013-02-17 ENCOUNTER — Ambulatory Visit (HOSPITAL_COMMUNITY)
Admission: RE | Admit: 2013-02-17 | Discharge: 2013-02-17 | Disposition: A | Discharge status: Home / Self Care | Payer: Medicaid Other | Source: Ambulatory Visit | Attending: Nurse Practitioner | Admitting: Nurse Practitioner

## 2013-02-17 VITALS — BP 126/76 | HR 75

## 2013-02-17 DIAGNOSIS — O358XX0 Maternal care for other (suspected) fetal abnormality and damage, not applicable or unspecified: Secondary | ICD-10-CM

## 2013-02-17 DIAGNOSIS — O09529 Supervision of elderly multigravida, unspecified trimester: Secondary | ICD-10-CM | POA: Insufficient documentation

## 2013-02-17 DIAGNOSIS — Z3689 Encounter for other specified antenatal screening: Secondary | ICD-10-CM | POA: Insufficient documentation

## 2013-02-17 DIAGNOSIS — O34219 Maternal care for unspecified type scar from previous cesarean delivery: Secondary | ICD-10-CM | POA: Insufficient documentation

## 2013-03-04 ENCOUNTER — Encounter: Payer: Self-pay | Admitting: *Deleted

## 2013-03-17 DIAGNOSIS — O99320 Drug use complicating pregnancy, unspecified trimester: Secondary | ICD-10-CM | POA: Insufficient documentation

## 2013-03-17 DIAGNOSIS — IMO0002 Reserved for concepts with insufficient information to code with codable children: Secondary | ICD-10-CM

## 2013-03-17 DIAGNOSIS — O0930 Supervision of pregnancy with insufficient antenatal care, unspecified trimester: Secondary | ICD-10-CM

## 2013-03-17 DIAGNOSIS — D649 Anemia, unspecified: Secondary | ICD-10-CM

## 2013-03-17 DIAGNOSIS — Q210 Ventricular septal defect: Secondary | ICD-10-CM

## 2013-03-19 ENCOUNTER — Encounter: Payer: Medicaid Other | Admitting: Obstetrics & Gynecology

## 2013-03-24 ENCOUNTER — Ambulatory Visit (INDEPENDENT_AMBULATORY_CARE_PROVIDER_SITE_OTHER): Payer: Medicaid Other | Admitting: Obstetrics & Gynecology

## 2013-03-24 ENCOUNTER — Encounter: Payer: Self-pay | Admitting: Obstetrics & Gynecology

## 2013-03-24 VITALS — BP 105/71 | Temp 96.9°F | Wt 204.6 lb

## 2013-03-24 DIAGNOSIS — O09529 Supervision of elderly multigravida, unspecified trimester: Secondary | ICD-10-CM

## 2013-03-24 DIAGNOSIS — IMO0002 Reserved for concepts with insufficient information to code with codable children: Secondary | ICD-10-CM

## 2013-03-24 DIAGNOSIS — O0993 Supervision of high risk pregnancy, unspecified, third trimester: Secondary | ICD-10-CM | POA: Insufficient documentation

## 2013-03-24 DIAGNOSIS — O358XX0 Maternal care for other (suspected) fetal abnormality and damage, not applicable or unspecified: Secondary | ICD-10-CM

## 2013-03-24 DIAGNOSIS — O35BXX0 Maternal care for other (suspected) fetal abnormality and damage, fetal cardiac anomalies, not applicable or unspecified: Secondary | ICD-10-CM

## 2013-03-24 LAB — POCT URINALYSIS DIP (DEVICE)
Bilirubin Urine: NEGATIVE
GLUCOSE, UA: NEGATIVE mg/dL
Ketones, ur: NEGATIVE mg/dL
Leukocytes, UA: NEGATIVE
NITRITE: NEGATIVE
Protein, ur: NEGATIVE mg/dL
Urobilinogen, UA: 0.2 mg/dL (ref 0.0–1.0)
pH: 5.5 (ref 5.0–8.0)

## 2013-03-24 LAB — CBC
HEMATOCRIT: 30.7 % — AB (ref 36.0–46.0)
HEMOGLOBIN: 10.3 g/dL — AB (ref 12.0–15.0)
MCH: 32.5 pg (ref 26.0–34.0)
MCHC: 33.6 g/dL (ref 30.0–36.0)
MCV: 96.8 fL (ref 78.0–100.0)
Platelets: 220 10*3/uL (ref 150–400)
RBC: 3.17 MIL/uL — AB (ref 3.87–5.11)
RDW: 13.3 % (ref 11.5–15.5)
WBC: 11.6 10*3/uL — ABNORMAL HIGH (ref 4.0–10.5)

## 2013-03-24 LAB — RPR

## 2013-03-24 LAB — HIV ANTIBODY (ROUTINE TESTING W REFLEX): HIV: NONREACTIVE

## 2013-03-24 NOTE — Patient Instructions (Signed)
Cesarean Delivery °Care After °Refer to this sheet in the next few weeks. These instructions provide you with information on caring for yourself after your procedure. Your health care provider may also give you specific instructions. Your treatment has been planned according to current medical practices, but problems sometimes occur. Call your health care provider if you have any problems or questions after you go home. °HOME CARE INSTRUCTIONS  °· Only take over-the-counter or prescription medications as directed by your health care provider. °· Do not drink alcohol, especially if you are breastfeeding or taking medication to relieve pain. °· Do not chew or smoke tobacco. °· Continue to use good perineal care. Good perineal care includes: °· Wiping your perineum from front to back. °· Keeping your perineum clean. °· Check your surgical cut (incision) daily for increased redness, drainage, swelling, or separation of skin. °· Clean your incision gently with soap and water every day, and then pat it dry. If your health care provider says it is OK, leave the incision uncovered. Use a bandage (dressing) if the incision is draining fluid or appears irritated. If the adhesive strips across the incision do not fall off within 7 days, carefully peel them off. °· Hug a pillow when coughing or sneezing until your incision is healed. This helps to relieve pain. °· Do not use tampons or douche until your health care provider says it is okay. °· Shower, wash your hair, and take tub baths as directed by your health care provider. °· Wear a well-fitting bra that provides breast support. °· Limit wearing support panties or control-top hose. °· Drink enough fluids to keep your urine clear or pale yellow. °· Eat high-fiber foods such as whole grain cereals and breads, brown rice, beans, and fresh fruits and vegetables every day. These foods may help prevent or relieve constipation. °· Resume activities such as climbing stairs,  driving, lifting, exercising, or traveling as directed by your health care provider. °· Talk to your health care provider about resuming sexual activities. This is dependent upon your risk of infection, your rate of healing, and your comfort and desire to resume sexual activity. °· Try to have someone help you with your household activities and your newborn for at least a few days after you leave the hospital. °· Rest as much as possible. Try to rest or take a nap when your newborn is sleeping. °· Increase your activities gradually. °· Keep all of your scheduled postpartum appointments. It is very important to keep your scheduled follow-up appointments. At these appointments, your health care provider will be checking to make sure that you are healing physically and emotionally. °SEEK MEDICAL CARE IF:  °· You are passing large clots from your vagina. Save any clots to show your health care provider. °· You have a foul smelling discharge from your vagina. °· You have trouble urinating. °· You are urinating frequently. °· You have pain when you urinate. °· You have a change in your bowel movements. °· You have increasing redness, pain, or swelling near your incision. °· You have pus draining from your incision. °· Your incision is separating. °· You have painful, hard, or reddened breasts. °· You have a severe headache. °· You have blurred vision or see spots. °· You feel sad or depressed. °· You have thoughts of hurting yourself or your newborn. °· You have questions about your care, the care of your newborn, or medications. °· You are dizzy or lightheaded. °· You have a rash. °· You   have pain, redness, or swelling at the site of the removed intravenous access (IV) tube. °· You have nausea or vomiting. °· You stopped breastfeeding and have not had a menstrual period within 12 weeks of stopping. °· You are not breastfeeding and have not had a menstrual period within 12 weeks of delivery. °· You have a fever. °SEEK  IMMEDIATE MEDICAL CARE IF: °· You have persistent pain. °· You have chest pain. °· You have shortness of breath. °· You faint. °· You have leg pain. °· You have stomach pain. °· Your vaginal bleeding saturates 2 or more sanitary pads in 1 hour. °MAKE SURE YOU:  °· Understand these instructions. °· Will watch your condition. °· Will get help right away if you are not doing well or get worse. °Document Released: 09/30/2001 Document Revised: 09/10/2012 Document Reviewed: 09/05/2011 °ExitCare® Patient Information ©2014 [employer]. ° ° ° °

## 2013-03-24 NOTE — Progress Notes (Signed)
[facility] in cesarean scar seen at last CS by Dr Gaynell FaceMarshall, which was done electively at 37.3 weeks. She had Cs in [place] for second stage arrest at term, followed by 2 repeat LTCS at 39 week, then the delivery by Dr. Gaynell FaceMarshall. No Records for first CS available, will request these. Consider repeat CS 36-37 weeks

## 2013-03-24 NOTE — Progress Notes (Signed)
P=84  Pt needs prescription for prenatal vitamins/28 wk labs/is considering TDap

## 2013-03-25 ENCOUNTER — Encounter: Payer: Self-pay | Admitting: Obstetrics & Gynecology

## 2013-03-25 DIAGNOSIS — O358XX0 Maternal care for other (suspected) fetal abnormality and damage, not applicable or unspecified: Secondary | ICD-10-CM | POA: Insufficient documentation

## 2013-03-25 DIAGNOSIS — O35BXX0 Maternal care for other (suspected) fetal abnormality and damage, fetal cardiac anomalies, not applicable or unspecified: Secondary | ICD-10-CM | POA: Insufficient documentation

## 2013-03-25 LAB — GLUCOSE TOLERANCE, 1 HOUR (50G) W/O FASTING: Glucose, 1 Hour GTT: 92 mg/dL (ref 70–140)

## 2013-03-27 ENCOUNTER — Encounter: Payer: Self-pay | Admitting: Maternal and Fetal Medicine

## 2013-04-07 ENCOUNTER — Ambulatory Visit (INDEPENDENT_AMBULATORY_CARE_PROVIDER_SITE_OTHER): Payer: Medicaid Other | Admitting: Family

## 2013-04-07 VITALS — BP 113/72 | Temp 98.0°F | Wt 209.5 lb

## 2013-04-07 DIAGNOSIS — O0993 Supervision of high risk pregnancy, unspecified, third trimester: Secondary | ICD-10-CM

## 2013-04-07 DIAGNOSIS — O99320 Drug use complicating pregnancy, unspecified trimester: Secondary | ICD-10-CM

## 2013-04-07 DIAGNOSIS — O34219 Maternal care for unspecified type scar from previous cesarean delivery: Secondary | ICD-10-CM

## 2013-04-07 DIAGNOSIS — F1920 Other psychoactive substance dependence, uncomplicated: Secondary | ICD-10-CM

## 2013-04-07 LAB — POCT URINALYSIS DIP (DEVICE)
Bilirubin Urine: NEGATIVE
GLUCOSE, UA: NEGATIVE mg/dL
Ketones, ur: NEGATIVE mg/dL
Leukocytes, UA: NEGATIVE
Nitrite: NEGATIVE
PH: 6 (ref 5.0–8.0)
Protein, ur: NEGATIVE mg/dL
SPECIFIC GRAVITY, URINE: 1.02 (ref 1.005–1.030)
Urobilinogen, UA: 0.2 mg/dL (ref 0.0–1.0)

## 2013-04-07 NOTE — Progress Notes (Signed)
p-89 

## 2013-04-07 NOTE — Progress Notes (Signed)
Reviewed 1 hr results (nml); Inquired about drug use since pregnancy (cocaine or marijuana); pt states has not used since discovering pregnancy, reviewed with pt risks with cocaine use (abruption and fetal death) and if assistance was needed to let us know.  Urine drug screen today.  Message routed to schedule pt repeat csection on May 1 (36.3 wks) due to history of classical csection, assistant requested per recommendation of Dr. Shawnie PonsPratt.

## 2013-04-07 NOTE — Addendum Note (Signed)
Addended by: Aldona LentoFISHER, Jorel Gravlin L on: 04/07/2013 10:51 AM   Modules accepted: Orders

## 2013-04-08 LAB — PRESCRIPTION MONITORING PROFILE (19 PANEL)
Amphetamine/Meth: NEGATIVE ng/mL
BARBITURATE SCREEN, URINE: NEGATIVE ng/mL
BENZODIAZEPINE SCREEN, URINE: NEGATIVE ng/mL
BUPRENORPHINE, URINE: NEGATIVE ng/mL
Cannabinoid Scrn, Ur: NEGATIVE ng/mL
Carisoprodol, Urine: NEGATIVE ng/mL
Cocaine Metabolites: NEGATIVE ng/mL
Creatinine, Urine: 99.07 mg/dL (ref >20.0)
Fentanyl, Ur: NEGATIVE ng/mL
MDMA URINE: NEGATIVE ng/mL
Meperidine, Ur: NEGATIVE ng/mL
Methadone Screen, Urine: NEGATIVE ng/mL
Methaqualone: NEGATIVE ng/mL
NITRITES URINE, INITIAL: NEGATIVE ug/mL
OPIATE SCREEN, URINE: NEGATIVE ng/mL
Oxycodone Screen, Ur: NEGATIVE ng/mL
PROPOXYPHENE: NEGATIVE ng/mL
Phencyclidine, Ur: NEGATIVE ng/mL
TAPENTADOLUR: NEGATIVE ng/mL
TRAMADOL UR: NEGATIVE ng/mL
ZOLPIDEM, URINE: NEGATIVE ng/mL
pH, Initial: 5.9 pH (ref 4.5–8.9)

## 2013-04-09 LAB — URINE CULTURE

## 2013-04-21 ENCOUNTER — Encounter: Payer: Self-pay | Admitting: Obstetrics & Gynecology

## 2013-04-30 ENCOUNTER — Encounter: Payer: Self-pay | Admitting: Obstetrics & Gynecology

## 2013-05-08 ENCOUNTER — Encounter (HOSPITAL_COMMUNITY): Payer: Self-pay | Admitting: Pharmacist

## 2013-05-14 ENCOUNTER — Encounter: Payer: Self-pay | Admitting: Obstetrics & Gynecology

## 2013-05-19 NOTE — Patient Instructions (Addendum)
   Your procedure is scheduled on:  Friday, May 1  Enter through the [facility]tr[facility] of [facility] at:  945 AM Pick up the phone at the desk and dial [phone] and inform us of your arrival.  Please call this number if you have any problems the morning of surgery: [phone]  Remember: Do not eat or drink after midnight: Thursday Take these medicines the morning of surgery with a SIP OF WATER:  None  Do not wear jewelry, make-up, or FINGER nail polish No metal in your hair or on your body. Do not wear lotions, powders, perfumes.  You may wear deodorant.  Do not bring valuables to the hospital. Contacts, dentures or bridgework may not be worn into surgery.  Leave suitcase in the car. After Surgery it may be brought to your room. For patients being admitted to the hospital, checkout time is 11:00am the day of discharge.    Home with husband April Knight cell  [phone].

## 2013-05-20 ENCOUNTER — Encounter (HOSPITAL_COMMUNITY)
Admission: RE | Admit: 2013-05-20 | Discharge: 2013-05-20 | Disposition: A | Discharge status: Home / Self Care | Payer: Medicaid Other | Source: Ambulatory Visit | Attending: Obstetrics and Gynecology | Admitting: Obstetrics and Gynecology

## 2013-05-20 ENCOUNTER — Encounter (HOSPITAL_COMMUNITY): Payer: Self-pay

## 2013-05-20 VITALS — BP 108/75 | HR 76 | Resp 20 | Ht 65.0 in | Wt 221.0 lb

## 2013-05-20 DIAGNOSIS — O34219 Maternal care for unspecified type scar from previous cesarean delivery: Secondary | ICD-10-CM

## 2013-05-20 LAB — CBC
HCT: 30.2 % — ABNORMAL LOW (ref 36.0–46.0)
Hemoglobin: 10 g/dL — ABNORMAL LOW (ref 12.0–15.0)
MCH: 30.9 pg (ref 26.0–34.0)
MCHC: 33.1 g/dL (ref 30.0–36.0)
MCV: 93.2 fL (ref 78.0–100.0)
Platelets: 204 10*3/uL (ref 150–400)
RBC: 3.24 MIL/uL — ABNORMAL LOW (ref 3.87–5.11)
RDW: 12.8 % (ref 11.5–15.5)
WBC: 10.2 10*3/uL (ref 4.0–10.5)

## 2013-05-20 LAB — RPR

## 2013-05-22 ENCOUNTER — Inpatient Hospital Stay (HOSPITAL_COMMUNITY)
Admission: RE | Admit: 2013-05-22 | Discharge: 2013-05-25 | DRG: 765 | Disposition: A | Discharge status: Home / Self Care | Payer: Medicaid Other | Source: Ambulatory Visit | Attending: Obstetrics and Gynecology | Admitting: Obstetrics and Gynecology

## 2013-05-22 ENCOUNTER — Encounter (HOSPITAL_COMMUNITY): Payer: Self-pay | Admitting: Anesthesiology

## 2013-05-22 ENCOUNTER — Inpatient Hospital Stay (HOSPITAL_COMMUNITY): Payer: Medicaid Other | Admitting: Anesthesiology

## 2013-05-22 ENCOUNTER — Encounter (HOSPITAL_COMMUNITY)
Admission: RE | Disposition: A | Discharge status: Home / Self Care | Payer: Self-pay | Source: Ambulatory Visit | Attending: Obstetrics and Gynecology

## 2013-05-22 ENCOUNTER — Encounter (HOSPITAL_COMMUNITY): Payer: Medicaid Other | Admitting: Anesthesiology

## 2013-05-22 DIAGNOSIS — O99344 Other mental disorders complicating childbirth: Secondary | ICD-10-CM | POA: Diagnosis present

## 2013-05-22 DIAGNOSIS — O358XX0 Maternal care for other (suspected) fetal abnormality and damage, not applicable or unspecified: Secondary | ICD-10-CM

## 2013-05-22 DIAGNOSIS — F1210 Cannabis abuse, uncomplicated: Secondary | ICD-10-CM | POA: Diagnosis present

## 2013-05-22 DIAGNOSIS — Z302 Encounter for sterilization: Secondary | ICD-10-CM

## 2013-05-22 DIAGNOSIS — O09529 Supervision of elderly multigravida, unspecified trimester: Secondary | ICD-10-CM | POA: Diagnosis present

## 2013-05-22 DIAGNOSIS — O34219 Maternal care for unspecified type scar from previous cesarean delivery: Secondary | ICD-10-CM

## 2013-05-22 DIAGNOSIS — F1410 Cocaine abuse, uncomplicated: Secondary | ICD-10-CM | POA: Diagnosis present

## 2013-05-22 DIAGNOSIS — O9902 Anemia complicating childbirth: Secondary | ICD-10-CM | POA: Diagnosis present

## 2013-05-22 DIAGNOSIS — Z87891 Personal history of nicotine dependence: Secondary | ICD-10-CM

## 2013-05-22 DIAGNOSIS — D649 Anemia, unspecified: Secondary | ICD-10-CM

## 2013-05-22 DIAGNOSIS — Z98891 History of uterine scar from previous surgery: Secondary | ICD-10-CM

## 2013-05-22 LAB — PREPARE RBC (CROSSMATCH)

## 2013-05-22 SURGERY — Surgical Case
Anesthesia: Spinal | Site: Abdomen

## 2013-05-22 MED ORDER — SCOPOLAMINE 1 MG/3DAYS TD PT72
1.0000 | MEDICATED_PATCH | Freq: Once | TRANSDERMAL | Status: DC
Start: 2013-05-22 — End: 2013-05-22

## 2013-05-22 MED ORDER — MORPHINE SULFATE (PF) 0.5 MG/ML IJ SOLN
INTRAMUSCULAR | Status: DC | PRN
  Administered 2013-05-22: .15 mg via INTRATHECAL

## 2013-05-22 MED ORDER — NALOXONE HCL 0.4 MG/ML IJ SOLN
0.4000 mg | INTRAMUSCULAR | Status: DC | PRN
Start: 2013-05-22 — End: 2013-05-22

## 2013-05-22 MED ORDER — PHENYLEPHRINE 8 MG IN D5W 100 ML (0.08MG/ML) PREMIX OPTIME
INJECTION | INTRAVENOUS | Status: AC
  Filled 2013-05-22: qty 100

## 2013-05-22 MED ORDER — FENTANYL CITRATE 0.05 MG/ML IJ SOLN
INTRAMUSCULAR | Status: AC
  Filled 2013-05-22: qty 2

## 2013-05-22 MED ORDER — MAGNESIUM HYDROXIDE 400 MG/5ML PO SUSP: 30.0000 mL | ORAL | Status: DC | PRN

## 2013-05-22 MED ORDER — FENTANYL CITRATE 0.05 MG/ML IJ SOLN
INTRAMUSCULAR | Status: DC | PRN
  Administered 2013-05-22: 25 ug via INTRATHECAL

## 2013-05-22 MED ORDER — SCOPOLAMINE 1 MG/3DAYS TD PT72
MEDICATED_PATCH | TRANSDERMAL | Status: AC
  Administered 2013-05-22: 1.5 mg via TRANSDERMAL
  Filled 2013-05-22: qty 1

## 2013-05-22 MED ORDER — ZOLPIDEM TARTRATE 5 MG PO TABS: 5.0000 mg | ORAL_TABLET | Freq: Every evening | ORAL | Status: DC | PRN

## 2013-05-22 MED ORDER — NALBUPHINE HCL 10 MG/ML IJ SOLN: 5.0000 mg | INTRAMUSCULAR | Status: DC | PRN

## 2013-05-22 MED ORDER — BUPIVACAINE IN DEXTROSE 0.75-8.25 % IT SOLN
INTRATHECAL | Status: DC | PRN
Start: 2013-05-22 — End: 2013-05-22
  Administered 2013-05-22: 1.6 mL via INTRATHECAL

## 2013-05-22 MED ORDER — WITCH HAZEL-GLYCERIN EX PADS: 1.0000 "application " | MEDICATED_PAD | CUTANEOUS | Status: DC | PRN

## 2013-05-22 MED ORDER — IBUPROFEN 600 MG PO TABS
600.0000 mg | ORAL_TABLET | Freq: Four times a day (QID) | ORAL | Status: DC
  Administered 2013-05-22 – 2013-05-25 (×10): 600 mg via ORAL
  Filled 2013-05-22 (×10): qty 1

## 2013-05-22 MED ORDER — LACTATED RINGERS IV SOLN
INTRAVENOUS | Status: DC
  Administered 2013-05-22: 22:00:00 via INTRAVENOUS

## 2013-05-22 MED ORDER — SIMETHICONE 80 MG PO CHEW
80.0000 mg | CHEWABLE_TABLET | ORAL | Status: DC
  Administered 2013-05-22 – 2013-05-25 (×3): 80 mg via ORAL
  Filled 2013-05-22 (×3): qty 1

## 2013-05-22 MED ORDER — SENNOSIDES-DOCUSATE SODIUM 8.6-50 MG PO TABS
2.0000 | ORAL_TABLET | ORAL | Status: DC
  Administered 2013-05-22 – 2013-05-25 (×3): 2 via ORAL
  Filled 2013-05-22 (×3): qty 2

## 2013-05-22 MED ORDER — DIBUCAINE 1 % RE OINT: 1.0000 "application " | TOPICAL_OINTMENT | RECTAL | Status: DC | PRN

## 2013-05-22 MED ORDER — 0.9 % SODIUM CHLORIDE (POUR BTL) OPTIME
TOPICAL | Status: DC | PRN
  Administered 2013-05-22: 1000 mL

## 2013-05-22 MED ORDER — KETOROLAC TROMETHAMINE 30 MG/ML IJ SOLN
30.0000 mg | Freq: Four times a day (QID) | INTRAMUSCULAR | Status: DC | PRN
  Administered 2013-05-22: 30 mg via INTRAMUSCULAR

## 2013-05-22 MED ORDER — DIPHENHYDRAMINE HCL 25 MG PO CAPS
25.0000 mg | ORAL_CAPSULE | ORAL | Status: DC | PRN
  Filled 2013-05-22: qty 1

## 2013-05-22 MED ORDER — OXYTOCIN 40 UNITS IN LACTATED RINGERS INFUSION - SIMPLE MED: 62.5000 mL/h | INTRAVENOUS | Status: AC

## 2013-05-22 MED ORDER — PHENYLEPHRINE 8 MG IN D5W 100 ML (0.08MG/ML) PREMIX OPTIME
INJECTION | INTRAVENOUS | Status: DC | PRN
Start: 2013-05-22 — End: 2013-05-22
  Administered 2013-05-22: 60 ug/min via INTRAVENOUS

## 2013-05-22 MED ORDER — ONDANSETRON HCL 4 MG/2ML IJ SOLN
INTRAMUSCULAR | Status: DC | PRN
  Administered 2013-05-22: 4 mg via INTRAVENOUS

## 2013-05-22 MED ORDER — OXYTOCIN 10 UNIT/ML IJ SOLN
INTRAMUSCULAR | Status: AC
  Filled 2013-05-22: qty 4

## 2013-05-22 MED ORDER — MENTHOL 3 MG MT LOZG: 1.0000 | LOZENGE | OROMUCOSAL | Status: DC | PRN

## 2013-05-22 MED ORDER — METOCLOPRAMIDE HCL 5 MG/ML IJ SOLN: 10.0000 mg | Freq: Three times a day (TID) | INTRAMUSCULAR | Status: DC | PRN

## 2013-05-22 MED ORDER — BUPIVACAINE IN DEXTROSE 0.75-8.25 % IT SOLN
INTRATHECAL | Status: AC
  Filled 2013-05-22: qty 4

## 2013-05-22 MED ORDER — LACTATED RINGERS IV SOLN
INTRAVENOUS | Status: DC | PRN
  Administered 2013-05-22 (×2): via INTRAVENOUS

## 2013-05-22 MED ORDER — SCOPOLAMINE 1 MG/3DAYS TD PT72
1.0000 | MEDICATED_PATCH | TRANSDERMAL | Status: DC
Start: 2013-05-22 — End: 2013-05-22
  Administered 2013-05-22: 1.5 mg via TRANSDERMAL

## 2013-05-22 MED ORDER — LACTATED RINGERS IV SOLN
INTRAVENOUS | Status: DC
  Administered 2013-05-22 (×2): via INTRAVENOUS

## 2013-05-22 MED ORDER — NALBUPHINE HCL 10 MG/ML IJ SOLN
10.0000 mg | INTRAMUSCULAR | Status: DC | PRN
  Administered 2013-05-22 – 2013-05-23 (×5): 10 mg via INTRAVENOUS
  Filled 2013-05-22 (×6): qty 1

## 2013-05-22 MED ORDER — OXYTOCIN 10 UNIT/ML IJ SOLN
40.0000 [IU] | INTRAVENOUS | Status: DC | PRN
  Administered 2013-05-22: 40 [IU] via INTRAVENOUS

## 2013-05-22 MED ORDER — KETOROLAC TROMETHAMINE 30 MG/ML IJ SOLN
INTRAMUSCULAR | Status: AC
  Filled 2013-05-22: qty 1

## 2013-05-22 MED ORDER — OXYCODONE-ACETAMINOPHEN 5-325 MG PO TABS
1.0000 | ORAL_TABLET | ORAL | Status: DC | PRN
  Administered 2013-05-23 (×4): 1 via ORAL
  Administered 2013-05-24: 2 via ORAL
  Administered 2013-05-24: 1 via ORAL
  Administered 2013-05-24: 2 via ORAL
  Administered 2013-05-24: 1 via ORAL
  Administered 2013-05-25 (×2): 2 via ORAL
  Filled 2013-05-22: qty 1
  Filled 2013-05-22: qty 2
  Filled 2013-05-22: qty 1
  Filled 2013-05-22 (×2): qty 2
  Filled 2013-05-22 (×6): qty 1

## 2013-05-22 MED ORDER — KETOROLAC TROMETHAMINE 30 MG/ML IJ SOLN: 30.0000 mg | Freq: Four times a day (QID) | INTRAMUSCULAR | Status: DC | PRN

## 2013-05-22 MED ORDER — DIPHENHYDRAMINE HCL 50 MG/ML IJ SOLN: 12.5000 mg | INTRAMUSCULAR | Status: DC | PRN

## 2013-05-22 MED ORDER — ONDANSETRON HCL 4 MG/2ML IJ SOLN: 4.0000 mg | Freq: Three times a day (TID) | INTRAMUSCULAR | Status: DC | PRN

## 2013-05-22 MED ORDER — LANOLIN HYDROUS EX OINT: 1.0000 "application " | TOPICAL_OINTMENT | CUTANEOUS | Status: DC | PRN

## 2013-05-22 MED ORDER — PRENATAL MULTIVITAMIN CH
1.0000 | ORAL_TABLET | Freq: Every day | ORAL | Status: DC
  Administered 2013-05-23 – 2013-05-24 (×2): 1 via ORAL
  Filled 2013-05-22 (×3): qty 1

## 2013-05-22 MED ORDER — DIPHENHYDRAMINE HCL 50 MG/ML IJ SOLN: 25.0000 mg | INTRAMUSCULAR | Status: DC | PRN

## 2013-05-22 MED ORDER — MEPERIDINE HCL 25 MG/ML IJ SOLN: 6.2500 mg | INTRAMUSCULAR | Status: DC | PRN

## 2013-05-22 MED ORDER — SIMETHICONE 80 MG PO CHEW
80.0000 mg | CHEWABLE_TABLET | Freq: Three times a day (TID) | ORAL | Status: DC
  Administered 2013-05-23 – 2013-05-24 (×8): 80 mg via ORAL
  Filled 2013-05-22 (×7): qty 1

## 2013-05-22 MED ORDER — NALOXONE HCL 1 MG/ML IJ SOLN
1.0000 ug/kg/h | INTRAVENOUS | Status: DC | PRN
  Filled 2013-05-22: qty 2

## 2013-05-22 MED ORDER — ONDANSETRON HCL 4 MG PO TABS
4.0000 mg | ORAL_TABLET | ORAL | Status: DC | PRN
Start: 2013-05-22 — End: 2013-05-25

## 2013-05-22 MED ORDER — MORPHINE SULFATE 0.5 MG/ML IJ SOLN
INTRAMUSCULAR | Status: AC
  Filled 2013-05-22: qty 10

## 2013-05-22 MED ORDER — LACTATED RINGERS IV SOLN: INTRAVENOUS | Status: DC

## 2013-05-22 MED ORDER — TETANUS-DIPHTH-ACELL PERTUSSIS 5-2.5-18.5 LF-MCG/0.5 IM SUSP: 0.5000 mL | Freq: Once | INTRAMUSCULAR | Status: DC

## 2013-05-22 MED ORDER — ONDANSETRON HCL 4 MG/2ML IJ SOLN
INTRAMUSCULAR | Status: AC
  Filled 2013-05-22: qty 2

## 2013-05-22 MED ORDER — SIMETHICONE 80 MG PO CHEW: 80.0000 mg | CHEWABLE_TABLET | ORAL | Status: DC | PRN

## 2013-05-22 MED ORDER — MEASLES, MUMPS & RUBELLA VAC ~~LOC~~ INJ: 0.5000 mL | INJECTION | Freq: Once | SUBCUTANEOUS | Status: DC

## 2013-05-22 MED ORDER — CEFAZOLIN SODIUM-DEXTROSE 2-3 GM-% IV SOLR
INTRAVENOUS | Status: AC
  Administered 2013-05-22: 2 g via INTRAVENOUS
  Filled 2013-05-22: qty 50

## 2013-05-22 MED ORDER — ONDANSETRON HCL 4 MG/2ML IJ SOLN: 4.0000 mg | INTRAMUSCULAR | Status: DC | PRN

## 2013-05-22 MED ORDER — DIPHENHYDRAMINE HCL 25 MG PO CAPS
25.0000 mg | ORAL_CAPSULE | Freq: Four times a day (QID) | ORAL | Status: DC | PRN
  Administered 2013-05-22: 25 mg via ORAL
  Filled 2013-05-22: qty 1

## 2013-05-22 MED ORDER — SODIUM CHLORIDE 0.9 % IJ SOLN: 3.0000 mL | INTRAMUSCULAR | Status: DC | PRN

## 2013-05-22 MED ORDER — FENTANYL CITRATE 0.05 MG/ML IJ SOLN: 25.0000 ug | INTRAMUSCULAR | Status: DC | PRN

## 2013-05-22 SURGICAL SUPPLY — 39 items
BRR ADH 6X5 SEPRAFILM 1 SHT (MISCELLANEOUS)
CLAMP CORD UMBIL (MISCELLANEOUS) IMPLANT
CONTAINER PREFILL 10% NBF 15ML (MISCELLANEOUS) IMPLANT
CUTTER LINEAR ENDO 35 ETS TH (STAPLE) ×2 IMPLANT
DRAPE LG THREE QUARTER DISP (DRAPES) IMPLANT
DRESSING TELFA 8X3 (GAUZE/BANDAGES/DRESSINGS) ×2 IMPLANT
DRSG OPSITE POSTOP 4X10 (GAUZE/BANDAGES/DRESSINGS) ×3 IMPLANT
DURAPREP 26ML APPLICATOR (WOUND CARE) ×3 IMPLANT
ELECT REM PT RETURN 9FT ADLT (ELECTROSURGICAL) ×3
ELECTRODE REM PT RTRN 9FT ADLT (ELECTROSURGICAL) ×1 IMPLANT
EXTRACTOR VACUUM M CUP 4 TUBE (SUCTIONS) IMPLANT
EXTRACTOR VACUUM M CUP 4' TUBE (SUCTIONS)
GAUZE SPONGE 4X4 12PLY STRL LF (GAUZE/BANDAGES/DRESSINGS) ×2 IMPLANT
GLOVE BIOGEL PI IND STRL 6.5 (GLOVE) ×1 IMPLANT
GLOVE BIOGEL PI INDICATOR 6.5 (GLOVE) ×2
GLOVE SURG SS PI 6.0 STRL IVOR (GLOVE) ×3 IMPLANT
GOWN STRL REUS W/TWL LRG LVL3 (GOWN DISPOSABLE) ×6 IMPLANT
HEMOSTAT SURGICEL 4X8 (HEMOSTASIS) ×2 IMPLANT
KIT ABG SYR 3ML LUER SLIP (SYRINGE) IMPLANT
NDL HYPO 25X5/8 SAFETYGLIDE (NEEDLE) IMPLANT
NEEDLE HYPO 25X5/8 SAFETYGLIDE (NEEDLE) IMPLANT
NS IRRIG 1000ML POUR BTL (IV SOLUTION) ×3 IMPLANT
PACK C SECTION WH (CUSTOM PROCEDURE TRAY) ×3 IMPLANT
PAD ABD 7.5X8 STRL (GAUZE/BANDAGES/DRESSINGS) ×2 IMPLANT
PAD OB MATERNITY 4.3X12.25 (PERSONAL CARE ITEMS) ×3 IMPLANT
RTRCTR C-SECT PINK 25CM LRG (MISCELLANEOUS) IMPLANT
SEPRAFILM MEMBRANE 5X6 (MISCELLANEOUS) IMPLANT
SPONGE LAP 18X18 X RAY DECT (DISPOSABLE) ×2 IMPLANT
STAPLER VISISTAT 35W (STAPLE) IMPLANT
SUT PLAIN 0 NONE (SUTURE) IMPLANT
SUT PLAIN 2 0 (SUTURE) ×3
SUT PLAIN ABS 2-0 CT1 27XMFL (SUTURE) IMPLANT
SUT VIC AB 0 CT1 36 (SUTURE) ×12 IMPLANT
SUT VIC AB 2-0 SH 27 (SUTURE) ×3
SUT VIC AB 2-0 SH 27XBRD (SUTURE) IMPLANT
SUT VIC AB 4-0 KS 27 (SUTURE) ×3 IMPLANT
TOWEL OR 17X24 6PK STRL BLUE (TOWEL DISPOSABLE) ×3 IMPLANT
TRAY FOLEY CATH 14FR (SET/KITS/TRAYS/PACK) ×3 IMPLANT
WATER STERILE IRR 1000ML POUR (IV SOLUTION) ×3 IMPLANT

## 2013-05-22 NOTE — Anesthesia Preprocedure Evaluation (Addendum)
Anesthesia Evaluation  Patient identified by MRN, date of birth, ID band Patient awake    Reviewed: Allergy & Precautions, H&P , NPO status , Patient's Chart, lab work & pertinent test results  Airway Mallampati: III TM Distance: >3 FB Neck ROM: Full    Dental no notable dental hx. 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,   Endo/Other  negative endocrine ROS  Renal/GU negative Renal ROS  negative genitourinary   Musculoskeletal negative musculoskeletal ROS (+)   Abdominal (+) + obese,   Peds  Hematology  (+) anemia ,   Anesthesia Other Findings   Reproductive/Obstetrics (+) Pregnancy Previous C/Section x 4 AMA Fetal Large VSD                          Anesthesia Physical Anesthesia Plan  ASA: II  Anesthesia Plan: Spinal and Combined Spinal and Epidural   Post-op Pain Management:    Induction: Intravenous  Airway Management Planned: Natural Airway  Additional Equipment:   Intra-op Plan:   Post-operative Plan:   Informed Consent: I have reviewed the patients History and Physical, chart, labs and discussed the procedure including the risks, benefits and alternatives for the proposed anesthesia with the patient or authorized representative who has indicated his/her understanding and acceptance.     Plan Discussed with: Anesthesiologist, CRNA and Surgeon  Anesthesia Plan Comments:         Anesthesia Quick Evaluation

## 2013-05-22 NOTE — Op Note (Signed)
Leafy KindleStephanie E Puskas PROCEDURE DATE: 05/22/2013  PREOPERATIVE DIAGNOSES: Intrauterine pregnancy at  3251w3d weeks gestation; history of 4 prior cesarean sections (2 transverse and 2 classical); hx of uterine dehiscence x2; undesired fertility  POSTOPERATIVE DIAGNOSES: The same, failed tubal ligation   PROCEDURE: Repeat Classical Cesarean Section with vertical skin, failed attempt at bilateral tubal ligation.   SURGEON:  Dr. Catalina AntiguaPeggy Constant  ASSISTANT:  Dr. Rulon AbideKeli Quinn Quam  ANESTHESIOLOGIST: Dr. Malen GauzeFoster  INDICATIONS: Ned GraceStephanie E Neils is a [age] y.o. [phone] at 3051w3d here for cesarean section and bilateral tubal sterilization secondary to the indications listed under preoperative diagnoses; please see preoperative note for further details.    The risks of surgery were discussed with the patient including but were not limited to: bleeding which may require transfusion or reoperation; infection which may require antibiotics; injury to bowel, bladder, ureters or other surrounding organs; injury to the fetus; need for additional procedures including hysterectomy in the event of a life-threatening hemorrhage; placental abnormalities wth subsequent pregnancies, incisional problems, thromboembolic phenomenon and other postoperative/anesthesia complications.  Patient also desires permanent sterilization.  Other reversible forms of contraception were discussed with patient; she declines all other modalities. Risks of procedure discussed with patient including but not limited to: risk of regret, permanence of method, bleeding, infection, injury to surrounding organs and need for additional procedures.  Failure risk of 1-2% with increased risk of ectopic gestation if pregnancy occurs was also discussed with patient.  The patient concurred with the proposed plan, giving informed written consent for the procedures.    FINDINGS:  Viable female infant in cephalic presentation.  Apgars 8 and 8.  Clear amniotic fluid.   Anterior placenta, three vessel cord. Anterior abdominal wall scarred to the uterus throughout without any clear delineation of the two.    ANESTHESIA: Combined spinal and epidural anesthesia INTRAVENOUS FLUIDS: 2400 ml ESTIMATED BLOOD LOSS: 800 ml URINE OUTPUT:  100 ml SPECIMENS: Placenta sent to L&D COMPLICATIONS: Dense scarring of the uterus to the anterior abdominal wall with no clear planes.    PROCEDURE IN DETAIL:  The patient preoperatively received intravenous antibiotics and had sequential compression devices applied to her lower extremities.   She was then taken to the operating room where combined spinal and epidural anesthesia was administered and was found to be adequate. She was then placed in a dorsal supine position with a leftward tilt, and prepped and draped in a sterile manner.  A foley catheter was placed into her bladder and attached to constant gravity.  After an adequate timeout was performed, a vertical skin incision through her previous scar was made with scalpel and carried through to the underlying layer of fascia. The fascia was incised in the midline, and this incision was extended anteriorly and posteriorly using the electrocautery.  No midline was able to be identified due to the dense scarring.  The rectus muscles were attempted to be bluntly dissected to the sides but eventually electrocautery was used. It was noted that immediately with going through these layers, the anterior uterine wall was also entered.  At that time, a scalpel was used to extend the uterine incision and the anterior placenta was then encountered.  The incision was extended with bandage scissors anteriorly and posteriorly. The infant was successfully delivered using vacuum assistance, the cord was clamped and cut and the infant was handed over to awaiting neonatology team. Uterine massage was then administered, and the placenta delivered intact with a three-vessel cord. The uterus was then cleared of  clot  and debris.  The classical hysterotomy was closed with 0 Vicryl in a running locked fashion, and 2 imbricating layers were also placed with 0 Vicryl.  Due to the dense scarring, we were unable to see either fallopian tube ovaries on either side. However, a finger was easily swept to both sides and able to palpate normal tubes without significant palpable adhesions.  Hemostasis was confirmed on all surfaces.  Surgicel was placed for additional hemostasis.  The fascia was then closed using 0 PDS in a running fashion.  The subcutaneous layer was irrigated, then reapproximated with 2-0 plain gut interrupted stitches.  The skin was closed with staples for improved cosmesis. The patient tolerated the procedure well. Sponge, lap, instrument and needle counts were correct x 2.  She was taken to the recovery room in stable condition.  Patient was notified of her failed bilateral tubal ligation and we discussed the option of a laparoscopic tubal ligation in the interval period.   Vale HavenKeli L Ezzie Senat, MD

## 2013-05-22 NOTE — Op Note (Signed)
I was present for the entire length of this case and agree with above documentation.

## 2013-05-22 NOTE — Anesthesia Procedure Notes (Signed)
Spinal  Patient location during procedure: OR Start time: 05/22/2013 11:45 AM Staffing Anesthesiologist: Santhiago Collingsworth A. Performed by: anesthesiologist  Preanesthetic Checklist Completed: patient identified, site marked, surgical consent, pre-op evaluation, timeout performed, IV checked, risks and benefits discussed and monitors and equipment checked Spinal Block Patient position: sitting Prep: site prepped and draped and DuraPrep Patient monitoring: cardiac monitor, continuous pulse ox, blood pressure and heart rate Approach: midline Location: L3-4 Injection technique: catheter Needle Needle type: Tuohy and Sprotte  Needle gauge: 24 G Needle length: 12.7 cm Needle insertion depth: 5 cm Catheter type: closed end flexible Catheter size: 19 g Catheter at skin depth: 10 cm Assessment Sensory level: T4 Additional Notes Epidural performed then SAB through epidural needle. Sprotte removed and epidural catheter threaded 5 cm into epidural space. Sterile dressing applied. Patient then placed supine with LUD. Patient tolerated procedure well.

## 2013-05-22 NOTE — Anesthesia Postprocedure Evaluation (Signed)
  Anesthesia Post-op Note  Patient: April Knight  Procedure(s) Performed: Procedure(s): CESAREAN SECTION (N/A)  Patient Location: PACU and Mother/Baby  Anesthesia Type:Spinal  Level of Consciousness: awake, alert , oriented and patient cooperative  Airway and Oxygen Therapy: Patient Spontanous Breathing  Post-op Pain: none  Post-op Assessment: Post-op Vital signs reviewed, Patient's Cardiovascular Status Stable and Respiratory Function Stable  Post-op Vital Signs: Reviewed and stable  Last Vitals:  Filed Vitals:   05/22/13 1648  BP: 109/64  Pulse: 71  Temp: 36.8 C  Resp: 18    Complications: No apparent anesthesia complications

## 2013-05-22 NOTE — Anesthesia Postprocedure Evaluation (Signed)
  Anesthesia Post-op Note  Patient: April GraceStephanie E Dayley  Procedure(s) Performed: Procedure(s): CESAREAN SECTION (N/A)  Patient Location: PACU  Anesthesia Type:Spinal and Epidural  Level of Consciousness: awake, alert  and oriented  Airway and Oxygen Therapy: Patient Spontanous Breathing  Post-op Pain: none  Post-op Assessment: Post-op Vital signs reviewed, Patient's Cardiovascular Status Stable, Respiratory Function Stable, Patent Airway, No signs of Nausea or vomiting, Pain level controlled, No headache and No backache  Post-op Vital Signs: Reviewed and stable  Last Vitals:  Filed Vitals:   05/22/13 1415  BP:   Pulse: 94  Temp:   Resp: 28    Complications: No apparent anesthesia complications

## 2013-05-22 NOTE — Lactation Note (Signed)
This note was copied from the chart of April Knight. Lactation Consultation Note Initial visit at 9 hours of age.  Mom reports baby is acting hungry now.  Baby is 3472w3d and weighs 5#11oz.  Discussed late preterm feeding behavior and possible supplementation if baby is not doing regular feedings. Hand expression demonstrated with colostrum visible. Assisted with latch in football hold on right breast.  Baby latches well with suckling burst and wide flanged lips.  Mom holds baby close to breast with breast covering nose and face.  Discussed with mom being able to see bridge of nose and what a good latch looks like.  Advised visitors to assist with this as mom is a c/s.  Breast massage to stimulate suckling demonstrated.  Encouraged to feed with early cues and to wake baby every 3 hours for STS if not waking on her own.  [facility] and suggested considering spoon feedings if baby is not consistent with feedings during the night.  Mom to call for assist as needed.    Patient Name: April Knight Today's Date: 05/22/2013 Reason for consult: Initial assessment   Maternal Data Has patient been taught Hand Expression?: Yes Does the patient have breastfeeding experience prior to this delivery?: No  Feeding Feeding Type: Breast Fed Length of feed:  (observed 10 minutes with stimulation)  LATCH Score/Interventions Latch: Repeated attempts needed to sustain latch, nipple held in mouth throughout feeding, stimulation needed to elicit sucking reflex.  Audible Swallowing: A few with stimulation  Type of Nipple: Everted at rest and after stimulation  Comfort (Breast/Nipple): Soft / non-tender     Hold (Positioning): Assistance needed to correctly position infant at breast and maintain latch. Intervention(s): Breastfeeding basics reviewed;Support Pillows;Position options;Skin to skin  LATCH Score: 7  Lactation Tools Discussed/Used     Consult Status Consult  Status: Follow-up Date: 05/23/13 Follow-up type: In-patient    Arvella MerlesJana Lynn Gerome Kokesh 05/22/2013, 9:57 PM

## 2013-05-22 NOTE — Addendum Note (Signed)
Addendum created 05/22/13 1736 by Orlie Pollenebra R Bartosz Luginbill, CRNA   Modules edited: Notes Section   Notes Section:  File: [id number]

## 2013-05-22 NOTE — H&P (Signed)
LABOR ADMISSION HISTORY AND PHYSICAL  Ned GraceStephanie E Barton is a 38 y.o. female [phone] with IUP at 3559w3d presenting for elective repeat c/s due to hx of uterine scar dehiscence x2 and classical c/s x2. Some rare contractions. No vb, lof. +FM.  Hx of LTCS x2 initially and then 2 following c/s with noted uterine dehiscence at time of the c/s resulting in classical section.      [facility]  Prenatal History/Complications:  Past Medical History: History reviewed. No pertinent past medical history.  Past Surgical History: Past Surgical History  Procedure Laterality Date  . Cesarean section  1995,2004,2007,2009     x 4    Obstetrical History: OB History   Grav Para Term Preterm Abortions TAB SAB Ect Mult Living   6 4 4  1 1    4      Social History: History   Social History  . Marital Status: Single    Spouse Name: N/A    Number of Children: N/A  . Years of Education: N/A   Social History Main Topics  . Smoking status: Former Smoker -- 0.25 packs/day for 10 years    Types: Cigarettes    Quit date: 10/22/2012  . Smokeless tobacco: Never Used  . Alcohol Use: No  . Drug Use: No  . Sexual Activity: Yes    Birth Control/ Protection: None     Comment: pregnant   Other Topics Concern  . None   Social History Narrative  . None    Family History: History reviewed. No pertinent family history.  Allergies: No Known Allergies  No prescriptions prior to admission     Review of Systems   All systems reviewed and negative except as stated in HPI  Blood pressure 116/70, pulse 82, temperature 97.9 F (36.6 C), temperature source Oral, resp. rate 20, last menstrual period 08/05/2012, SpO2 99.00%. General appearance: alert, cooperative and no distress Lungs: clear to auscultation bilaterally Heart: regular rate and rhythm Abdomen: soft, non-tender; bowel sounds normal Pelvic: /a Extremities: Homans sign is negative, no sign of DVT DTR's  2+ Presentation: cephalic Fetal monitoring 120 on doppler Uterine activityNone     Prenatal labs: ABO, Rh: --/--/O POS (04/29 1545) Antibody: NEG (04/29 1545) Rubella:   RPR: NON REAC (04/29 1545)  HBsAg: Negative (12/22 0000)  HIV: NON REACTIVE (03/03 1209)  GBS:    1 hr Glucola 92 Genetic screening  Not done Anatomy US VSD- fetal echo with large perimebranous VSD- should not affect delivery per cardiology.    Results for orders placed during the hospital encounter of 05/22/13 (from the past 24 hour(s))  PREPARE RBC (CROSSMATCH)   Collection Time    05/22/13  9:45 AM      Result Value Ref Range   Order Confirmation ORDER PROCESSED BY BLOOD BANK      Assessment: Ned GraceStephanie E Chaffin is a 38 y.o. [id number] at 6459w3d here for elective repeat c/s with hx of 2 prior classical due to uterine dehiscence. Patient also desires BTL and papers signed 03/04/13.  The risks of cesarean section discussed with the patient included but were not limited to: bleeding which may require transfusion or reoperation; infection which may require antibiotics; injury to bowel, bladder, ureters or other surrounding organs; injury to the fetus; need for additional procedures including hysterectomy in the event of a life-threatening hemorrhage; placental abnormalities wth subsequent pregnancies, incisional problems, thromboembolic phenomenon and other postoperative/anesthesia complications. The patient concurred with the proposed plan, giving informed  written consent for the procedure.   Patient has been NPO since midnight she will remain NPO for procedure. Anesthesia and OR aware. Preoperative prophylactic antibiotics and SCDs ordered on call to the OR.  To OR when ready.  Patient desires permanent sterilization.  Other reversible forms of contraception were discussed with patient; she declines all other modalities. Risks of procedure discussed with patient including but not limited to: risk of regret, permanence  of method, bleeding, infection, injury to surrounding organs and need for additional procedures.  Failure risk of 1-2 % with increased risk of ectopic gestation if pregnancy occurs was also discussed with patient.  Patient verbalized understanding of these risks and wants to proceed with sterilization.  Written informed consent obtained.  To OR when ready.     Keli L Beck 05/22/2013, 11:21 AM    Attestation of Attending Supervision of Advanced Practitioner (CNM/NP): Evaluation and management procedures were performed by the Advanced Practitioner under my supervision and collaboration.  I have reviewed the Advanced Practitioner's note and chart, and I agree with the management and plan.  Nazaret Chea 05/22/2013 11:30 AM

## 2013-05-22 NOTE — Transfer of Care (Signed)
Immediate Anesthesia Transfer of Care Note  Patient: April Knight  Procedure(s) Performed: Procedure(s): CESAREAN SECTION (N/A)  Patient Location: PACU  Anesthesia Type:Spinal  Level of Consciousness: awake, alert , oriented and patient cooperative  Airway & Oxygen Therapy: Patient Spontanous Breathing  Post-op Assessment: Report given to PACU RN and Post -op Vital signs reviewed and stable  Post vital signs: Reviewed and stable  Complications: No apparent anesthesia complications

## 2013-05-22 NOTE — Progress Notes (Signed)
UR completed 

## 2013-05-23 LAB — CBC
HEMATOCRIT: 25.4 % — AB (ref 36.0–46.0)
HEMOGLOBIN: 8.4 g/dL — AB (ref 12.0–15.0)
MCH: 30.5 pg (ref 26.0–34.0)
MCHC: 33.1 g/dL (ref 30.0–36.0)
MCV: 92.4 fL (ref 78.0–100.0)
Platelets: 191 10*3/uL (ref 150–400)
RBC: 2.75 MIL/uL — ABNORMAL LOW (ref 3.87–5.11)
RDW: 12.8 % (ref 11.5–15.5)
WBC: 11.8 10*3/uL — ABNORMAL HIGH (ref 4.0–10.5)

## 2013-05-23 NOTE — Progress Notes (Signed)
Subjective: Postpartum Day 1: Cesarean Delivery Eating, drinking, voiding, ambulating well.  +flatus.  Lochia and pain wnl.  Denies dizziness, lightheadedness, or sob. No complaints.     Objective: Vital signs in last 24 hours: Temp:  [97.2 F (36.2 C)-98.5 F (36.9 C)] 98.4 F (36.9 C) (05/02 0438) Pulse Rate:  [54-94] 71 (05/02 0438) Resp:  [16-28] 20 (05/02 0438) BP: (89-122)/(49-81) 112/69 mmHg (05/02 0438) SpO2:  [97 %-100 %] 97 % (05/02 0438) Weight:  [100.245 kg (221 lb)] 100.245 kg (221 lb) (05/01 1300)  Physical Exam:  General: alert, cooperative and no distress Lochia: appropriate Uterine Fundus: firm Incision: no significant drainage, large bulky dressing on- will take off in shower and leave honeycomb dressing on DVT Evaluation: No evidence of DVT seen on physical exam. Negative Homan's sign. No cords or calf tenderness. No significant calf/ankle edema.   Recent Labs  05/20/13 1545 05/23/13 0628  HGB 10.0* 8.4*  HCT 30.2* 25.4*    Assessment/Plan: Status post Cesarean section. Doing well postoperatively.  Continue current care. Breastfeeding.  Plans for interim BTL Plan for d/c tomorrow.  Cheron EveryKimberly Randall [facility] 05/23/2013, 9:27 AM

## 2013-05-24 LAB — TYPE AND SCREEN
ABO/RH(D): O POS
ANTIBODY SCREEN: NEGATIVE
Unit division: 0
Unit division: 0

## 2013-05-24 NOTE — Progress Notes (Signed)
Subjective: Postpartum Day 2: Cesarean Delivery Patient reports tolerating PO, + flatus and no problems voiding.    Objective: Vital signs in last 24 hours: Temp:  [98 F (36.7 C)-98.4 F (36.9 C)] 98 F (36.7 C) (05/03 0845) Pulse Rate:  [72-90] 90 (05/03 0610) Resp:  [18-20] 18 (05/03 0610) BP: (103-104)/(69-72) 104/69 mmHg (05/03 0610)  Physical Exam:  General: alert, cooperative and no distress Lochia: appropriate Uterine Fundus: firm Incision: no significant drainage, no significant erythema. Vertical incision. Staples in place.  DVT Evaluation: No cords or calf tenderness. No significant calf/ankle edema.   Recent Labs  05/23/13 0628  HGB 8.4*  HCT 25.4*    Assessment/Plan: Status post Cesarean section. Doing well postoperatively.  Continue current care. Depo with interval BTL Meeting goals  Baby in NICU for VSD   Smokey Melott L Caylan Chenard 05/24/2013, 3:44 PM

## 2013-05-24 NOTE — Progress Notes (Signed)
Clinical Social Work Department PSYCHOSOCIAL ASSESSMENT - MATERNAL/CHILD 05/24/2013  Patient:  April Knight,April Knight  Account Number:  [id number]  Admit Date:  05/22/2013  April Knight:   April Knight    Clinical Social Worker:  Rosella Crandell, LCSW   Date/Time:  05/24/2013 12:30 PM  Date Referred:  05/24/2013   Referral source  NICU     Referred reason  Substance Abuse   Other referral source:    I:  FAMILY / HOME ENVIRONMENT Child's legal guardian:  PARENT  Guardian - Knight Guardian - Age Guardian - Address  April Knight,April Knight [address]  [place], [place] [address]  April Knight 41 same as above   Other household support members/support persons Other support:    II  PSYCHOSOCIAL DATA Information Source:    Insurance claims handlerinancial and Community Resources Employment:   FOB is employed   Surveyor, quantityinancial resources:  [employer] If [place] [place]:   Other  [employer]state[employer]IC  Chemical engineerood Stamps   School / Grade:   Maternity Care Coordinator / Child Services Coordination / Early Interventions:  Cultural issues impacting care:    III  STRENGTHS Strengths  Home prepared for Child (including basic supplies)  Understanding of illness  Supportive family/friends   Strength comment:    IV  RISK FACTORS AND CURRENT PROBLEMS Current Problem:       V  SOCIAL WORK ASSESSMENT Acknowledged Social Work consult to assess mother's history of marijuana and cocaine.  Mother was receptive to social work intervention.  She is a single parent with three other dependents ages 5810, 657, and 725.  Informed that she and FOB cohabitate.  Mother reports hx of substance abuse.  She admits to hx of cocaine last use in 1999.  She admits to use of marijuana two times a week.  She reportedly stop using when she found out that she was pregnant.  Mother states that she has not used cocaine since 1999, and the cocaine may have been in the marijuana.    Mother reports hx of depression and states that she has never been formally diagnosed or  treated for depression.   She reports being hospitalized 16 years ago for substance abuse but stated that she left the program after 6 days.   Mother informed of the hospitals drug screen policy.  UDS on newborn was negative.  Spoke with mother regarding PP Depression.  Discussed signs and symptoms of PP Depression. Provided her with information and treatment options for PP Depression.  She denies any currently symptoms of depression.  Newborn was transferred to NICU.  Mother states that newborn is doing well in NICU.  She mentioned some transportation challenges in NICU.  Provided her with bus passes.  She was appreciative.  Informed her of CSW availability.      VI SOCIAL WORK PLAN Social Work Plan  Psychosocial Support/Ongoing Assessment of Needs   Type of pt/family education:   If child protective services report - county:   If child protective services report - date:   Information/referral to community resources comment:    Other social work plan:   Will continue to monitor drug screen.

## 2013-05-25 ENCOUNTER — Encounter (HOSPITAL_COMMUNITY): Payer: Self-pay | Admitting: Obstetrics and Gynecology

## 2013-05-25 MED ORDER — PRENATAL MULTIVITAMIN CH: 1.0000 | ORAL_TABLET | Freq: Every day | ORAL | Status: DC

## 2013-05-25 MED ORDER — MEDROXYPROGESTERONE ACETATE 150 MG/ML IM SUSP
150.0000 mg | Freq: Once | INTRAMUSCULAR | Status: AC
  Administered 2013-05-25: 150 mg via INTRAMUSCULAR
  Filled 2013-05-25: qty 1

## 2013-05-25 MED ORDER — IBUPROFEN 600 MG PO TABS: 600.0000 mg | ORAL_TABLET | Freq: Four times a day (QID) | ORAL | Status: DC

## 2013-05-25 NOTE — Discharge Summary (Signed)
  Obstetric Discharge Summary Reason for Admission: cesarean section Prenatal Procedures: none Intrapartum Procedures: cesarean: low cervical, transverse Postpartum Procedures: depo Complications-Operative and Postpartum: none  Hospital Course: April Knight is a 38 y.o. female [phone] with IUP at 5670w3d presented for elective repeat c/s due to hx of uterine scar dehiscence x2 and classical c/s x2. Pt had successful c-section on 5/1. See op note for details. Unable to tie tubes. Depo given prior to discharge. Meet milestones PP and stable for discharge.  H/H: Lab Results  Component Value Date/Time   HGB 8.4* 05/23/2013  6:28 AM   HGB 10.6 01/12/2013   HCT 25.4* 05/23/2013  6:28 AM   HCT 32 01/12/2013    Filed Vitals:   05/25/13 0526  BP: 118/78  Pulse: 82  Temp: 98.6 F (37 C)  Resp: 18    Physical Exam: VSS NAD Abd: Appropriately tender, ND, Fundus @U , staples in place Incision: c/d/i No c/c/e, Neg homan's sign, neg cords Lochia Appropriate  Discharge Diagnoses: Term Pregnancy-delivered  Discharge Information: Date: 08/03/2010 Activity: pelvic rest Diet: routine  Medications: PNV, Ibuprofen and Percocet Breast feeding:  Yes Condition: stable Instructions: refer to handout Discharge to: home      Medication List         ibuprofen 600 MG tablet  Commonly known as:  ADVIL,MOTRIN  Take 1 tablet (600 mg total) by mouth every 6 (six) hours.     prenatal multivitamin Tabs tablet  Take 1 tablet by mouth daily at 12 noon.           Follow-up Information   [facility] Schedule an appointment as soon as possible for a visit in 4 weeks. (For Postpartum Visit)    Specialty:  Obstetrics and Gynecology   Contact information:   [address] [place] [address] [phone]      Minta BalsamMichael R Shernita Rabinovich 05/25/2013,8:59 AM

## 2013-05-25 NOTE — Discharge Summary (Signed)
Attestation of Attending Supervision of Obstetric Fellow: Evaluation and management procedures were performed by the Obstetric Fellow under my supervision and collaboration.  I have reviewed the Obstetric Fellow's note and chart, and I agree with the management and plan.  Limuel Nieblas, MD, FACOG Attending Obstetrician & Gynecologist Faculty Practice, [facility]   

## 2013-05-25 NOTE — Progress Notes (Signed)
Ur chart review completed.  

## 2013-05-25 NOTE — Lactation Note (Signed)
This note was copied from the chart of April ThorntonStephanie Stegman. Lactation Consultation Note      Follow up consult with this mom of a NICU baby, now 1669 hours old and 36 6/7 weeks corrected gestation, weigning 5-9 at birth. I assisted mom with latching baby in the NICU. She was already with mom for about 30 minutes when i arrived,and was sleepy. I was able to get hr latched deeply 2-3 times, but after a few strong sucks, she would fall asleep. Mom is engorged, so I gave her ice packs, and assisted her with pumping.Mom is expressing about 30 mls at this time, which is a good amount.  I did fax WIC for mom, and Nedra, peer couselor, spoke to mom and gave her an appointment to later today. Discharge teaching done with mom  On pumping and milk transport. I will follow this mom in the nICU, and in o/p lactation as needed,  Patient Name: April Knight [id number]'s Date: 05/25/2013 Reason for consult: Follow-up assessment;NICU baby;Late preterm infant   Maternal Data Formula Feeding for Exclusion: Yes (baby in NICU - [facility] VSD and PDA) Has patient been taught Hand Expression?: Yes Does the patient have breastfeeding experience prior to this delivery?: No  Feeding Feeding Type: Formula Nipple Type: Slow - flow Length of feed: 30 min  LATCH Score/Interventions Latch: Repeated attempts needed to sustain latch, nipple held in mouth throughout feeding, stimulation needed to elicit sucking reflex. Intervention(s): Adjust position;Breast massage  Audible Swallowing: A few with stimulation Intervention(s): Skin to skin;Hand expression Intervention(s): Alternate breast massage  Type of Nipple: Everted at rest and after stimulation  Comfort (Breast/Nipple): Engorged, cracked, bleeding, large blisters, severe discomfort Problem noted: Engorgment Intervention(s): Ice  Problem noted: Mild/Moderate discomfort Interventions (Filling): Massage;Double electric pump Interventions (Mild/moderate  discomfort): Pre-pump if needed  Hold (Positioning): Assistance needed to correctly position infant at breast and maintain latch. Intervention(s): Support Pillows  LATCH Score: 5  Lactation Tools Discussed/Used Tools: Pump Breast pump type: Double-Electric Breast Pump WIC Program: Yes (mom has 1;30 appointment for DEP today) Pump Review: Setup, frequency, and cleaning;Milk Storage;Other (comment) (engordemtn care, transport pf EBM to the nICU)   Consult Status Consult Status: PRN Follow-up type: In-patient (NICU)    Alfred LevinsChristine Anne Marigold Mom 05/25/2013, 10:20 AM

## 2013-05-25 NOTE — Discharge Instructions (Signed)
°Iron-Rich Diet ° °An iron-rich diet contains foods that are good sources of iron. Iron is an important mineral that helps your body produce hemoglobin. Hemoglobin is a protein in red blood cells that carries oxygen to the body's tissues. Sometimes, the iron level in your blood can be low. This may be caused by: °· A lack of iron in your diet. °· Blood loss. °· Times of growth, such as during pregnancy or during a child's growth and development. °Low levels of iron can cause a decrease in the number of red blood cells. This can result in iron deficiency anemia. Iron deficiency anemia symptoms include: °· Tiredness. °· Weakness. °· Irritability. °· Increased chance of infection. °Here are some recommendations for daily iron intake: °· Males older than 38 years of age need 8 mg of iron per day. °· Women ages 19 to 50 need 18 mg of iron per day. °· Pregnant women need 27 mg of iron per day, and women who are over 19 years of age and breastfeeding need 9 mg of iron per day. °· Women over the age of 50 need 8 mg of iron per day. °SOURCES OF IRON °There are 2 types of iron that are found in food: heme iron and nonheme iron. Heme iron is absorbed by the body better than nonheme iron. Heme iron is found in meat, poultry, and fish. Nonheme iron is found in grains, beans, and vegetables. °Heme Iron Sources °Food / Iron (mg) °· Chicken liver, 3 oz (85 g)/ 10 mg °· Beef liver, 3 oz (85 g)/ 5.5 mg °· Oysters, 3 oz (85 g)/ 8 mg °· Beef, 3 oz (85 g)/ 2 to 3 mg °· Shrimp, 3 oz (85 g)/ 2.8 mg °· Turkey, 3 oz (85 g)/ 2 mg °· Chicken, 3 oz (85 g) / 1 mg °· Fish (tuna, halibut), 3 oz (85 g)/ 1 mg °· Pork, 3 oz (85 g)/ 0.9 mg °Nonheme Iron Sources °Food / Iron (mg) °· Ready-to-eat breakfast cereal, iron-fortified / 3.9 to 7 mg °· Tofu, ½ cup / 3.4 mg °· Kidney beans, ½ cup / 2.6 mg °· Baked potato with skin / 2.7 mg °· Asparagus, ½ cup / 2.2 mg °· Avocado / 2 mg °· Dried peaches, ½ cup / 1.6 mg °· Raisins, ½ cup / 1.5 mg °· Soy milk, 1  cup / 1.5 mg °· Whole-wheat bread, 1 slice / 1.2 mg °· Spinach, 1 cup / 0.8 mg °· Broccoli, ½ cup / 0.6 mg °IRON ABSORPTION °Certain foods can decrease the body's absorption of iron. Try to avoid these foods and beverages while eating meals with iron-containing foods: °· Coffee. °· Tea. °· Fiber. °· Soy. °Foods containing vitamin C can help increase the amount of iron your body absorbs from iron sources, especially from nonheme sources. Eat foods with vitamin C along with iron-containing foods to increase your iron absorption. Foods that are high in vitamin C include many fruits and vegetables. Some good sources are: °· Fresh orange juice. °· Oranges. °· Strawberries. °· Mangoes. °· Grapefruit. °· Red bell peppers. °· Green bell peppers. °· Broccoli. °· Potatoes with skin. °· Tomato juice. °Document Released: 08/22/2004 Document Revised: 04/02/2011 Document Reviewed: 06/29/2010 °ExitCare® Patient Information ©2014 [employer]. °Vaginal Delivery °Care After °Refer to this sheet in the next few weeks. These discharge instructions provide you with information on caring for yourself after delivery. Your caregiver may also give you specific instructions. Your treatment has been planned according to the most current   medical practices available, but problems sometimes occur. Call your caregiver if you have any problems or questions after you go home. °HOME CARE INSTRUCTIONS °· Take over-the-counter or prescription medicines only as directed by your caregiver or pharmacist. °· Do not drink alcohol, especially if you are breastfeeding or taking medicine to relieve pain. °· Do not chew or smoke tobacco. °· Do not use illegal drugs. °· Continue to use good perineal care. Good perineal care includes: °· Wiping your perineum from front to back. °· Keeping your perineum clean. °· Do not use tampons or douche until your caregiver says it is okay. °· Shower, wash your hair, and take tub baths as directed by your  caregiver. °· Wear a well-fitting bra that provides breast support. °· Eat healthy foods. °· Drink enough fluids to keep your urine clear or pale yellow. °· Eat high-fiber foods such as whole grain cereals and breads, brown rice, beans, and fresh fruits and vegetables every day. These foods may help prevent or relieve constipation. °· Follow your cargiver's recommendations regarding resumption of activities such as climbing stairs, driving, lifting, exercising, or traveling. °· Talk to your caregiver about resuming sexual activities. Resumption of sexual activities is dependent upon your risk of infection, your rate of healing, and your comfort and desire to resume sexual activity. °· Try to have someone help you with your household activities and your newborn for at least a few days after you leave the hospital. °· Rest as much as possible. Try to rest or take a nap when your newborn is sleeping. °· Increase your activities gradually. °· Keep all of your scheduled postpartum appointments. It is very important to keep your scheduled follow-up appointments. At these appointments, your caregiver will be checking to make sure that you are healing physically and emotionally. °SEEK MEDICAL CARE IF:  °· You are passing large clots from your vagina. Save any clots to show your caregiver. °· You have a foul smelling discharge from your vagina. °· You have trouble urinating. °· You are urinating frequently. °· You have pain when you urinate. °· You have a change in your bowel movements. °· You have increasing redness, pain, or swelling near your vaginal incision (episiotomy) or vaginal tear. °· You have pus draining from your episiotomy or vaginal tear. °· Your episiotomy or vaginal tear is separating. °· You have painful, hard, or reddened breasts. °· You have a severe headache. °· You have blurred vision or see spots. °· You feel sad or depressed. °· You have thoughts of hurting yourself or your newborn. °· You have  questions about your care, the care of your newborn, or medicines. °· You are dizzy or lightheaded. °· You have a rash. °· You have nausea or vomiting. °· You were breastfeeding and have not had a menstrual period within 12 weeks after you stopped breastfeeding. °· You are not breastfeeding and have not had a menstrual period by the 12th week after delivery. °· You have a fever. °SEEK IMMEDIATE MEDICAL CARE IF:  °· You have persistent pain. °· You have chest pain. °· You have shortness of breath. °· You faint. °· You have leg pain. °· You have stomach pain. °· Your vaginal bleeding saturates two or more sanitary pads in 1 hour. °MAKE SURE YOU:  °· Understand these instructions. °· Will watch your condition. °· Will get help right away if you are not doing well or get worse. °Document Released: 01/06/2000 Document Revised: 10/03/2011 Document Reviewed: 09/05/2011 °ExitCare® Patient Information ©  2014 [employer]. ° °

## 2013-05-27 ENCOUNTER — Ambulatory Visit: Payer: Self-pay

## 2013-05-27 NOTE — Lactation Note (Signed)
This note was copied from the chart of April Knight. Lactation Consultation Note    Follow up consult with this mom of a NICU baby, now 575 days old, and 37 1/7 weeks corrected gestation, and being discharged to home today. Mom roomed in with baby last night, and breast fed, pumped and bottle fed. She did feed 60 mls of formula twice. I explained to mom that avoiding formula would be wise to protect her milk supply, and explained  Why,. I also advised her to breast feed with cues, and  To continue with pmping to prtect her milk supply. I advised mom to try and supplement with EBm only. Mom will call for an o/p follow up lactation consult.pump  Patient Name: April Knight Reason for consult: Follow-up assessment;NICU baby   Maternal Data    Feeding Feeding Type: Breast Milk (per mom) Length of feed: 25 min  LATCH Score/Interventions                      Lactation Tools Discussed/Used     Consult Status Consult Status: Complete Follow-up type: Call as needed    Alfred LevinsChristine Anne Kirsty Monjaraz Knight, 1:49 PM

## 2013-05-28 ENCOUNTER — Ambulatory Visit (INDEPENDENT_AMBULATORY_CARE_PROVIDER_SITE_OTHER): Payer: Medicaid Other

## 2013-05-28 VITALS — BP 141/91 | HR 82 | Temp 98.2°F | Ht 66.0 in | Wt 215.6 lb

## 2013-05-28 DIAGNOSIS — Z4802 Encounter for removal of sutures: Secondary | ICD-10-CM

## 2013-05-28 DIAGNOSIS — Z09 Encounter for follow-up examination after completed treatment for conditions other than malignant neoplasm: Secondary | ICD-10-CM

## 2013-05-28 NOTE — Progress Notes (Signed)
Pt. Here today for staple removal. Pt. Afebrile, no complaints. Staples removed. Incision clean, dry and intact, edges well approximated. Steri strips placed. Pt. Advised to keep area clean, pat dry and that steri strips will fall off on their own. Educated pt. On s/s of infection and to call clinic or come to MAU should any arise. Pt.'s BP slightly elevated. Pt. Denies headache, SOB, dizziness, chest or abdominal pain. Consulted Dr. Debroah LoopArnold about pt.'s elevated BP; pt. Instructed to return in 1 week for a repeat BP check. Pt. Verbalized understanding and appointment made for pt to return 06/04/13.

## 2013-06-08 ENCOUNTER — Encounter: Payer: Self-pay | Admitting: General Practice

## 2013-06-11 ENCOUNTER — Encounter: Payer: Self-pay | Admitting: General Practice

## 2013-06-19 ENCOUNTER — Encounter: Payer: Self-pay | Admitting: General Practice

## 2013-07-02 ENCOUNTER — Telehealth: Payer: Self-pay | Admitting: *Deleted

## 2013-07-02 ENCOUNTER — Encounter: Payer: Self-pay | Admitting: Obstetrics and Gynecology

## 2013-07-02 ENCOUNTER — Ambulatory Visit: Payer: Medicaid Other | Admitting: Obstetrics and Gynecology

## 2013-07-02 NOTE — Telephone Encounter (Signed)
Called Patient and patient knew she missed appointment, states her daughter had been in the hospital and she would call and reschedule.

## 2013-07-20 ENCOUNTER — Encounter: Payer: Self-pay | Admitting: General Practice

## 2013-07-23 ENCOUNTER — Ambulatory Visit: Payer: Medicaid Other | Admitting: Family Medicine

## 2013-08-13 ENCOUNTER — Encounter (HOSPITAL_COMMUNITY): Admission: RE | Payer: Self-pay | Source: Ambulatory Visit

## 2013-08-13 ENCOUNTER — Ambulatory Visit (HOSPITAL_COMMUNITY)
Admission: RE | Admit: 2013-08-13 | Payer: Medicaid Other | Source: Ambulatory Visit | Admitting: Obstetrics & Gynecology

## 2013-08-13 SURGERY — SALPINGECTOMY, BILATERAL, LAPAROSCOPIC
Anesthesia: Choice | Site: Abdomen | Laterality: Bilateral

## 2013-08-19 ENCOUNTER — Ambulatory Visit: Payer: Medicaid Other | Admitting: Obstetrics and Gynecology

## 2013-08-19 ENCOUNTER — Telehealth: Payer: Self-pay | Admitting: General Practice

## 2013-08-19 ENCOUNTER — Encounter: Payer: Self-pay | Admitting: General Practice

## 2013-08-19 NOTE — Telephone Encounter (Signed)
Patient no showed for appt today. Called patient at both numbers, no answer- left message that we are calling because it appears you missed an appt with us today, please call our front office staff to reschedule. Will send letter

## 2013-09-04 ENCOUNTER — Encounter: Payer: Self-pay | Admitting: General Practice

## 2013-11-08 ENCOUNTER — Ambulatory Visit (HOSPITAL_COMMUNITY)
Admission: RE | Admit: 2013-11-08 | Discharge: 2013-11-08 | Disposition: A | Discharge status: Home / Self Care | Payer: Medicaid Other | Attending: Psychiatry | Admitting: Psychiatry

## 2013-11-08 DIAGNOSIS — Z653 Problems related to other legal circumstances: Secondary | ICD-10-CM | POA: Diagnosis not present

## 2013-11-08 DIAGNOSIS — F1490 Cocaine use, unspecified, uncomplicated: Secondary | ICD-10-CM | POA: Insufficient documentation

## 2013-11-08 DIAGNOSIS — Z598 Other problems related to housing and economic circumstances: Secondary | ICD-10-CM | POA: Insufficient documentation

## 2013-11-08 DIAGNOSIS — F419 Anxiety disorder, unspecified: Secondary | ICD-10-CM | POA: Diagnosis present

## 2013-11-08 NOTE — BH Assessment (Signed)
Per May, NP; PT does not meet inpt criteria.  Per Julieanne Cottonina, AC; provide PT w/outpt SA resources  CPS [facility]

## 2013-11-08 NOTE — BH Assessment (Addendum)
Assessment Note  April GraceStephanie E Knight is an 38 y.o. female.  PT reports [facility] her [facility].  PT denied current or hx SI, HI, AH, VH, or other types of psychosis.  PT reports snorting a gram of powdered cocaine along with sipping a beer, and taking tokes off a Marijuana joint last night.  She reports snorting powdered cocaine for the past 2 years and typically snorts 1x to 2x per week all night after her 4 children are in bed.  She reports getting the substances from her live-in Boy Friend who is a Physiological scientist"drug dealer."  PT reports a past hx of crack cocaine and chronic Marijuana use.  PT reports experiencing anxiety about the health of her infant Daughter and the fact that the live-in Boy Friend "is a drug dealer."  PT reports getting 7 to 8 hrs of sleep on nights she does not use cocaine.  She reports no problems w/appetite, going out in public, being around unfamiliar people, or hx of panic attacks.  PT's affect was labile in that she was tearful then agitated and then left the assessment area after grabbing paperwork.  PT returned to the assessment area after coaxing from her Christiana PellantBrother, Dewayne [place].  Mr. Barnabas ListerWashington provided collaborative info and reports his Sister/PT needs SA tx.  PT requested outpatient SA tx so that "I wont have to leave my children."  PT was informed that Triage Specialist is a mandated reporter.  PT reports a CPS case was recently closed after being positive for drugs at her Daughter's birth 5 mos ago.  PT provided w/outpt SA and concurring tx resources.          Axis I:  Cocaine Use Disorder, severe Axis II: Deferred Axis IV: economic problems, housing problems, problems related to legal system/crime and problems with primary support group Axis V: 51-60 moderate symptoms  Past Medical History: No past medical history on file.  Past Surgical History  Procedure Laterality Date  . Cesarean section  1995,2004,2007,2009     x 4   . Cesarean section N/A 05/22/2013    Procedure: CESAREAN SECTION;  Surgeon: Catalina AntiguaPeggy Constant, MD;  Location: [facility];  Service: Obstetrics;  Laterality: N/A;    Family History: No family history on file.  Social History:  reports that she quit smoking about 12 months ago. Her smoking use included Cigarettes. She has a 2.5 pack-year smoking history. She has never used smokeless tobacco. She reports that she does not drink alcohol or use illicit drugs.  Additional Social History:     CIWA:   COWS:    Allergies: No Known Allergies  Home Medications:  (Not in a hospital admission)  OB/GYN Status:  No LMP recorded.  General Assessment Data Location of Assessment: [facility] Services ACT Assessment: Yes Is this a Tele or Face-to-Face Assessment?: Face-to-Face Is this an Initial Assessment or a Re-assessment for this encounter?: Initial Assessment Living Arrangements: Spouse/significant other (and 4 children) Can pt return to current living arrangement?: Yes Admission Status: Voluntary Is patient capable of signing voluntary admission?: Yes Transfer from: Home Referral Source: Self/Family/Friend  Medical Screening Exam [facility] Walk-in ONLY) Medical Exam completed: No Reason for MSE not completed: Patient Refused  [facility] Living Arrangements: Spouse/significant other (and 4 children) Name of Psychiatrist: None Name of Therapist: Marchelle Folksmanda [facility] [place])  Education Status Is patient currently in school?: No Current Grade: N/A Highest grade of school patient has completed: N/A Name  of school: N/A Contact person: N/A  Risk to self with the past 6 months Suicidal Ideation: No Suicidal Intent: No Is patient at risk for suicide?: No Suicidal Plan?: No Access to Means: No What has been your use of drugs/alcohol within the last 12 months?: Cocaine, ETOH, and THC Previous Attempts/Gestures: No How many times?: 0 Other Self Harm Risks: None Triggers for Past  Attempts: None known Intentional Self Injurious Behavior: None Family Suicide History: Unknown Recent stressful life event(s): Other (Comment) (infant Daughter hadn open heart surgery) Persecutory voices/beliefs?: No Depression: No Depression Symptoms: Guilt Substance abuse history and/or treatment for substance abuse?: Yes Suicide prevention information given to non-admitted patients: Not applicable  Risk to Others within the past 6 months Homicidal Ideation: No Thoughts of Harm to Others: No Current Homicidal Intent: No Current Homicidal Plan: No Access to Homicidal Means: No Identified Victim: N/A History of harm to others?: No Assessment of Violence: None Noted Violent Behavior Description: N/A Does patient have access to weapons?: No Criminal Charges Pending?: No Does patient have a court date: No  Psychosis Hallucinations: None noted Delusions: None noted  Mental Status Report Appear/Hygiene: Disheveled Eye Contact: Fair Motor Activity: Agitation Speech: Pressured Level of Consciousness: Restless;Crying Mood: Anxious Affect: Labile Anxiety Level: Moderate Thought Processes: Coherent Judgement: Partial Orientation: Person;Place;Time;Situation Obsessive Compulsive Thoughts/Behaviors: None  Cognitive Functioning Concentration: Decreased Memory: Recent Intact;Remote Intact IQ: Average Insight: Poor Impulse Control: Poor Appetite: Good Weight Loss: 0 Weight Gain: 0 Sleep: No Change Total Hours of Sleep: 7 Vegetative Symptoms: None  ADLScreening [facility] Assessment Services) Patient's cognitive ability adequate to safely complete daily activities?: Yes Patient able to express need for assistance with ADLs?: Yes Independently performs ADLs?: Yes (appropriate for developmental age)  Prior Inpatient Therapy Prior Inpatient Therapy: No Prior Therapy Dates: N/A Prior Therapy Facilty/Provider(s): N/A Reason for Treatment: N/A  Prior Outpatient Therapy Prior  Outpatient Therapy: Yes Prior Therapy Dates: Current Prior Therapy Facilty/Provider(s): [facility] Reason for Treatment: Substance Use  ADL Screening (condition at time of admission) Patient's cognitive ability adequate to safely complete daily activities?: Yes Is the patient deaf or have difficulty hearing?: No Does the patient have difficulty seeing, even when wearing glasses/contacts?: No Does the patient have difficulty concentrating, remembering, or making decisions?: No Patient able to express need for assistance with ADLs?: Yes Does the patient have difficulty dressing or bathing?: No Independently performs ADLs?: Yes (appropriate for developmental age) Does the patient have difficulty walking or climbing stairs?: No Weakness of Legs: None Weakness of Arms/Hands: None  Home Assistive Devices/Equipment Home Assistive Devices/Equipment: None    Abuse/Neglect Assessment (Assessment to be complete while patient is alone) Physical Abuse: Denies Verbal Abuse: Denies Sexual Abuse: Denies Exploitation of patient/patient's resources: Denies Self-Neglect: Denies Values / Beliefs Cultural Requests During Hospitalization: None Spiritual Requests During Hospitalization: None        Additional Information 1:1 In Past 12 Months?: No CIRT Risk: No Elopement Risk: No Does patient have medical clearance?: No [facility] Walkin, refuse medical exam)     Disposition:  Disposition Initial Assessment Completed for this Encounter: Yes Disposition of Patient: Outpatient treatment (PT requested) Type of outpatient treatment: Chemical Dependence - Intensive Outpatient  On Site Evaluation by:   Reviewed with Physician:    Dey-Johnson,Shonteria Abeln 11/08/2013 5:38 PM

## 2013-11-23 ENCOUNTER — Encounter (HOSPITAL_COMMUNITY): Payer: Self-pay | Admitting: Obstetrics and Gynecology

## 2017-09-25 ENCOUNTER — Other Ambulatory Visit: Payer: Self-pay

## 2017-09-25 ENCOUNTER — Emergency Department (HOSPITAL_COMMUNITY)
Admission: EM | Admit: 2017-09-25 | Discharge: 2017-09-25 | Discharge status: Against Medical Advice | Payer: Medicaid Other

## 2017-09-25 ENCOUNTER — Encounter (HOSPITAL_COMMUNITY): Payer: Self-pay | Admitting: Emergency Medicine

## 2017-09-25 NOTE — ED Triage Notes (Addendum)
States  She took her bp at the pharmacy and it said it was high so she came in to get it checked, no h/a, no n/v/ no other s/s no pain , states has hx of preeclampsia 4 years ago weants a chantix rx

## 2018-01-14 ENCOUNTER — Emergency Department (HOSPITAL_COMMUNITY)
Admission: EM | Admit: 2018-01-14 | Discharge: 2018-01-15 | Disposition: A | Discharge status: Home / Self Care | Payer: Medicaid Other | Attending: Emergency Medicine | Admitting: Emergency Medicine

## 2018-01-14 ENCOUNTER — Encounter (HOSPITAL_COMMUNITY): Payer: Self-pay | Admitting: Emergency Medicine

## 2018-01-14 ENCOUNTER — Emergency Department (HOSPITAL_COMMUNITY): Payer: Medicaid Other

## 2018-01-14 DIAGNOSIS — F17210 Nicotine dependence, cigarettes, uncomplicated: Secondary | ICD-10-CM | POA: Insufficient documentation

## 2018-01-14 DIAGNOSIS — F1020 Alcohol dependence, uncomplicated: Secondary | ICD-10-CM | POA: Diagnosis not present

## 2018-01-14 DIAGNOSIS — G9520 Unspecified cord compression: Secondary | ICD-10-CM

## 2018-01-14 DIAGNOSIS — M4802 Spinal stenosis, cervical region: Secondary | ICD-10-CM | POA: Diagnosis not present

## 2018-01-14 DIAGNOSIS — F1499 Cocaine use, unspecified with unspecified cocaine-induced disorder: Secondary | ICD-10-CM | POA: Diagnosis not present

## 2018-01-14 DIAGNOSIS — Y900 Blood alcohol level of less than 20 mg/100 ml: Secondary | ICD-10-CM | POA: Insufficient documentation

## 2018-01-14 DIAGNOSIS — R200 Anesthesia of skin: Secondary | ICD-10-CM | POA: Diagnosis present

## 2018-01-14 LAB — COMPREHENSIVE METABOLIC PANEL
ALT: 30 U/L (ref 0–44)
AST: 44 U/L — ABNORMAL HIGH (ref 15–41)
Albumin: 4.1 g/dL (ref 3.5–5.0)
Alkaline Phosphatase: 47 U/L (ref 38–126)
Anion gap: 12 (ref 5–15)
BUN: 10 mg/dL (ref 6–20)
CALCIUM: 9.3 mg/dL (ref 8.9–10.3)
CO2: 22 mmol/L (ref 22–32)
CREATININE: 0.99 mg/dL (ref 0.44–1.00)
Chloride: 101 mmol/L (ref 98–111)
GFR calc Af Amer: >60 mL/min (ref >60)
GFR calc non Af Amer: >60 mL/min (ref >60)
Glucose, Bld: 92 mg/dL (ref 70–99)
Potassium: 4.8 mmol/L (ref 3.5–5.1)
Sodium: 135 mmol/L (ref 135–145)
TOTAL PROTEIN: 8 g/dL (ref 6.5–8.1)
Total Bilirubin: 1.2 mg/dL (ref 0.3–1.2)

## 2018-01-14 LAB — CBC
HCT: 36.9 % (ref 36.0–46.0)
Hemoglobin: 11.1 g/dL — ABNORMAL LOW (ref 12.0–15.0)
MCH: 26.4 pg (ref 26.0–34.0)
MCHC: 30.1 g/dL (ref 30.0–36.0)
MCV: 87.6 fL (ref 80.0–100.0)
Platelets: 258 10*3/uL (ref 150–400)
RBC: 4.21 MIL/uL (ref 3.87–5.11)
RDW: 15.2 % (ref 11.5–15.5)
WBC: 5.2 10*3/uL (ref 4.0–10.5)
nRBC: 0 % (ref 0.0–0.2)

## 2018-01-14 LAB — RAPID URINE DRUG SCREEN, HOSP PERFORMED
Amphetamines: NOT DETECTED
Barbiturates: NOT DETECTED
Benzodiazepines: NOT DETECTED
Cocaine: POSITIVE — AB
Opiates: NOT DETECTED
Tetrahydrocannabinol: NOT DETECTED

## 2018-01-14 LAB — ETHANOL: Alcohol, Ethyl (B): <10 mg/dL (ref <10)

## 2018-01-14 MED ORDER — PREDNISONE 10 MG PO TABS: ORAL_TABLET | ORAL | 0 refills | Status: DC

## 2018-01-14 MED ORDER — METHYLPREDNISOLONE SODIUM SUCC 125 MG IJ SOLR
125.0000 mg | Freq: Once | INTRAMUSCULAR | Status: AC
  Administered 2018-01-14: 125 mg via INTRAVENOUS
  Filled 2018-01-14: qty 2

## 2018-01-14 NOTE — ED Triage Notes (Signed)
Pt c/o left arm numbness and abd numbness for over a month. Reports that she feels her balance is off when she ambulates for the same time as other symptoms.

## 2018-01-14 NOTE — Discharge Instructions (Signed)
You were evaluated in the Emergency Department and after careful evaluation, we did not find any emergent condition requiring admission or further testing in the hospital.  Your symptoms today seem to be due to narrowing of the spine in the neck, causing compression and swelling of the spinal cord.  We discussed your MRI results with Dr. Jordan LikesPool of neurosurgery.  You will need surgery for this condition.  Please wear the soft collar at all times until you follow-up with Dr. Jordan LikesPool in clinic.  Please call as soon as possible, you need to be seen within the next 5 to 7 days.  Please take the steroid medication as directed to help with the inflammation in your spine.  Please return to the Emergency Department if you experience any worsening of your condition.  We encourage you to follow up with a primary care provider.  Thank you for allowing us to be a part of your care.

## 2018-01-14 NOTE — ED Provider Notes (Signed)
[facility] Emergency Department Provider Note MRN:  [id number]  Arrival date & time: 01/14/18     Chief Complaint   Numbness History of Present Illness   April Knight is a 42 y.o. year-old female with a history of alcohol abuse, cocaine abuse presenting to the ED with chief complaint of numbness.  1 month ago, patient woke up and was experiencing numbness to the left arm, which has persisted since that time.  Also endorsing balance issues since that time.  Denies numbness or weakness to the legs.  Explains that her right hand is starting to get numb as well.  Denies headache or vision change, no chest pain or shortness of breath, no abdominal pain but endorsing increased abdominal distention for the past few weeks.  Denies neck pain or back pain, no trauma to the head neck or back recently.  Patient also expresses concern over her drug and alcohol addictions.  Abuses cocaine, drinks 8-10 beers every day.  Wants help quitting.  Review of Systems  A complete 10 system review of systems was obtained and all systems are negative except as noted in the HPI and PMH.   Patient's Health History   History reviewed. No pertinent past medical history.  Past Surgical History:  Procedure Laterality Date  . CESAREAN SECTION  1995,2004,2007,2009    x 4  . CESAREAN SECTION N/A 05/22/2013   Procedure: CESAREAN SECTION;  Surgeon: Catalina Antigua, MD;  Location: [facility];  Service: Obstetrics;  Laterality: N/A;    No family history on file.  Social History   Socioeconomic History  . Marital status: Single    Spouse name: Not on file  . Number of children: Not on file  . Years of education: Not on file  . Highest education level: Not on file  Occupational History  . Not on file  Social Needs  . Financial resource strain: Not on file  . Food insecurity:    Worry: Not on file    Inability: Not on file  . Transportation needs:    Medical: Not on file    Non-medical:  Not on file  Tobacco Use  . Smoking status: Current Every Day Smoker    Packs/day: 0.25    Years: 10.00    Pack years: 2.50    Types: Cigarettes  . Smokeless tobacco: Never Used  Substance and Sexual Activity  . Alcohol use: Yes  . Drug use: Yes  . Sexual activity: Yes    Birth control/protection: None    Comment: pregnant  Lifestyle  . Physical activity:    Days per week: Not on file    Minutes per session: Not on file  . Stress: Not on file  Relationships  . Social connections:    Talks on phone: Not on file    Gets together: Not on file    Attends religious service: Not on file    Active member of club or organization: Not on file    Attends meetings of clubs or organizations: Not on file    Relationship status: Not on file  . Intimate partner violence:    Fear of current or ex partner: Not on file    Emotionally abused: Not on file    Physically abused: Not on file    Forced sexual activity: Not on file  Other Topics Concern  . Not on file  Social History Narrative  . Not on file     Physical Exam  Vital Signs and  Nursing Notes reviewed Vitals:   01/14/18 2006 01/14/18 2138  BP: (!) 155/98 (!) 142/108  Pulse: 75 90  Resp: 16 18  Temp:    SpO2: 96% 97%    CONSTITUTIONAL:  well-appearing, NAD NEURO:  Alert and oriented x 3, subjective decreased sensation to the left arm, mildly unsteady gait EYES:  eyes equal and reactive ENT/NECK:  no LAD, no JVD CARDIO: Regular rate, well-perfused, normal S1 and S2 PULM:  CTAB no wheezing or rhonchi GI/GU:  normal bowel sounds, non-distended, non-tender MSK/SPINE:  No gross deformities, no edema SKIN:  no rash, atraumatic PSYCH:  Appropriate speech and behavior  Diagnostic and Interventional Summary    Labs Reviewed  CBC - Abnormal; Notable for the following components:      Result Value   Hemoglobin 11.1 (*)    All other components within normal limits  COMPREHENSIVE METABOLIC PANEL - Abnormal; Notable for the  following components:   AST 44 (*)    All other components within normal limits  RAPID URINE DRUG SCREEN, HOSP PERFORMED - Abnormal; Notable for the following components:   Cocaine POSITIVE (*)    All other components within normal limits  ETHANOL    MR BRAIN WO CONTRAST  Final Result    MR CERVICAL SPINE WO CONTRAST  Final Result      Medications  methylPREDNISolone sodium succinate (SOLU-MEDROL) 125 mg/2 mL injection 125 mg (125 mg Intravenous Given 01/14/18 2339)     Procedures Critical Care Critical Care Documentation Critical care time provided by me (excluding procedures): 35 minutes  Condition necessitating critical care: Cervical spinal stenosis with cord impingement  Components of critical care management: reviewing of prior records, laboratory and imaging interpretation, frequent re-examination and reassessment of vital signs, administration of IV Solu-Medrol, discussion with consulting services.    ED Course and Medical Decision Making  I have reviewed the triage vital signs and the nursing notes.  Pertinent labs & imaging results that were available during my care of the patient were reviewed by me and considered in my medical decision making (see below for details).  Favoring Saturday night palsy in this 42 year old female with alcohol addiction, but patient does not recall a time when she would have acquired such a condition.  MRI to exclude stroke versus cervical spinal cord pathology.  MRI brain unremarkable.  MRI cervical spine reveals severe stenosis at C5-C6 with cord edema and impingement.  Discussed case with Dr. Jordan LikesPool of neurosurgery.  Given the duration of symptoms, no need for emergent surgery tonight.  We will follow-up in clinic.  Patient advised soft collar at all times, given dose of Solu-Medrol IV here in the ED.  Provided with steroid taper and contact information for Dr. Lindalou HosePool's office.  Stressed the importance of follow-up with neurosurgery, patient  expressed understanding that she will need surgery for this condition.  Provided with outpatient resources with regard to her addictions, advised against sudden stopping of alcohol to avoid dangerous withdrawal.  After the discussed management above, the patient was determined to be safe for discharge.  The patient was in agreement with this plan and all questions regarding their care were answered.  ED return precautions were discussed and the patient will return to the ED with any significant worsening of condition.  Elmer SowMichael M. Pilar PlateBero, MD [facility] [email or web address]  Final Clinical Impressions(s) / ED Diagnoses     ICD-10-CM   1. Cervical spinal stenosis M48.02   2. Spinal cord compression (  [facility]) G95.20     ED Discharge Orders         Ordered    predniSONE (DELTASONE) 10 MG tablet     01/14/18 2355             Sabas SousBero, Jazyah Butsch M, MD 01/14/18 2358

## 2018-01-19 ENCOUNTER — Other Ambulatory Visit: Payer: Self-pay

## 2018-01-19 ENCOUNTER — Emergency Department (HOSPITAL_COMMUNITY)
Admission: EM | Admit: 2018-01-19 | Discharge: 2018-01-19 | Disposition: A | Discharge status: Home / Self Care | Payer: Medicaid Other | Attending: Emergency Medicine | Admitting: Emergency Medicine

## 2018-01-19 ENCOUNTER — Encounter (HOSPITAL_COMMUNITY): Payer: Self-pay | Admitting: Emergency Medicine

## 2018-01-19 DIAGNOSIS — F17210 Nicotine dependence, cigarettes, uncomplicated: Secondary | ICD-10-CM | POA: Insufficient documentation

## 2018-01-19 DIAGNOSIS — R200 Anesthesia of skin: Secondary | ICD-10-CM | POA: Diagnosis not present

## 2018-01-19 DIAGNOSIS — R262 Difficulty in walking, not elsewhere classified: Secondary | ICD-10-CM | POA: Diagnosis present

## 2018-01-19 DIAGNOSIS — M4802 Spinal stenosis, cervical region: Secondary | ICD-10-CM | POA: Diagnosis not present

## 2018-01-19 MED ORDER — DEXAMETHASONE SODIUM PHOSPHATE 4 MG/ML IJ SOLN: 10.0000 mg | Freq: Once | INTRAMUSCULAR | Status: DC

## 2018-01-19 MED ORDER — DEXAMETHASONE SODIUM PHOSPHATE 10 MG/ML IJ SOLN
10.0000 mg | Freq: Once | INTRAMUSCULAR | Status: DC
  Administered 2018-01-19: 10 mg via INTRAVENOUS
  Filled 2018-01-19: qty 1

## 2018-01-19 MED ORDER — DEXAMETHASONE SODIUM PHOSPHATE 10 MG/ML IJ SOLN: 10.0000 mg | Freq: Once | INTRAMUSCULAR | Status: DC

## 2018-01-19 NOTE — ED Triage Notes (Signed)
C/o difficulty walking x 45 days.  States it is related to her cervical spinal stenosis that was diagnosed on 12/24.

## 2018-01-19 NOTE — ED Notes (Signed)
PT states understanding of care given, follow up care. PT ambulated from ED to car with a steady gait.  

## 2018-01-19 NOTE — ED Provider Notes (Signed)
[facility] Provider Note   CSN: [id number] Arrival date & time: 01/19/18  1846     History   Chief Complaint Chief Complaint  Patient presents with  . difficulty walking    HPI April Knight is a 42 y.o. female.  HPI   42yF with progressive weakness/numbness. Seen in ED on 12/24 and had MRI significant for severe stenosis at c5/6 with cord impingement/edema. Neurosurgery recommended soft collar and steroids with close outpt FU. Pt says she has been having worsening weakness and numbness, particularly in LUE. Says she has been take prednisone aside from not taking yet today. Continues to have difficulty with walking. Feels like she is constantly leaning forward   History reviewed. No pertinent past medical history.  Patient Active Problem List   Diagnosis Date Noted  . S/P C-section 05/22/2013  . Ventricular septal defect, fetal, affecting care of mother, antepartum 03/25/2013  . Supervision of high risk pregnancy in third trimester 03/24/2013  . Advanced maternal age (AMA) in pregnancy 03/17/2013  . Anemia 03/17/2013  . Late prenatal care 03/17/2013  . Drug use complicating pregnancy 03/17/2013  . Previous cesarean delivery, antepartum condition or complication 01/02/2013    Past Surgical History:  Procedure Laterality Date  . CESAREAN SECTION  1995,2004,2007,2009    x 4  . CESAREAN SECTION N/A 05/22/2013   Procedure: CESAREAN SECTION;  Surgeon: Catalina AntiguaPeggy Constant, MD;  Location: [facility];  Service: Obstetrics;  Laterality: N/A;     OB History    Gravida  6   Para  5   Term  4   Preterm  1   AB  1   Living  5     SAB      TAB  1   Ectopic      Multiple      Live Births  5            Home Medications    Prior to Admission medications   Medication Sig Start Date End Date Taking? Authorizing Provider  ibuprofen (ADVIL,MOTRIN) 600 MG tablet Take 1 tablet (600 mg total) by mouth every 6 (six)  hours. Patient not taking: Reported on 01/14/2018 05/25/13   Minta Balsamdom, Michael R, MD  predniSONE (DELTASONE) 10 MG tablet Take 4 tablets once a day for 3 days. Take 3 tablets once a day for the next 3 days. Take 2 tablets once a day for the next 3 days. Take 1 tablet once a day for the next 3 days. 01/14/18   Sabas SousBero, Michael M, MD  Prenatal Vit-Fe Fumarate-FA (PRENATAL MULTIVITAMIN) TABS tablet Take 1 tablet by mouth daily at 12 noon. Patient not taking: Reported on 01/14/2018 05/25/13   Minta Balsamdom, Michael R, MD    Family History No family history on file.  Social History Social History   Tobacco Use  . Smoking status: Current Every Day Smoker    Packs/day: 0.25    Years: 10.00    Pack years: 2.50    Types: Cigarettes  . Smokeless tobacco: Never Used  Substance Use Topics  . Alcohol use: Yes  . Drug use: Yes     Allergies   Patient has no known allergies.   Review of Systems Review of Systems  All systems reviewed and negative, other than as noted in HPI.  Physical Exam Updated Vital Signs BP (!) 125/95 (BP Location: Right Arm)   Pulse 89   Temp 98.4 F (36.9 C) (Oral)   Resp 18  LMP 12/24/2017   SpO2 100%   Physical Exam Vitals signs and nursing note reviewed.  Constitutional:      General: She is not in acute distress.    Appearance: She is well-developed.  HENT:     Head: Normocephalic and atraumatic.  Eyes:     General:        Right eye: No discharge.        Left eye: No discharge.     Conjunctiva/sclera: Conjunctivae normal.  Neck:     Musculoskeletal: Neck supple.  Cardiovascular:     Rate and Rhythm: Normal rate and regular rhythm.     Heart sounds: Normal heart sounds. No murmur. No friction rub. No gallop.   Pulmonary:     Effort: Pulmonary effort is normal. No respiratory distress.     Breath sounds: Normal breath sounds.  Abdominal:     General: There is no distension.     Palpations: Abdomen is soft.     Tenderness: There is no abdominal  tenderness.  Musculoskeletal:        General: No tenderness.  Skin:    General: Skin is warm and dry.  Neurological:     Mental Status: She is alert.     Comments: Speech clear.  Content appropriate cranial nerves II through XII intact.  Strength is 3+ out of 5 left upper extremity.  5 out of 5 right upper extremity.  4 out of 5 bilateral lower extremities.  Normal reflexes throughout.  Psychiatric:        Behavior: Behavior normal.        Thought Content: Thought content normal.      ED Treatments / Results  Labs (all labs ordered are listed, but only abnormal results are displayed) Labs Reviewed - No data to display  EKG None  Radiology No results found.  Procedures Procedures (including critical care time)  Medications Ordered in ED Medications - No data to display   Initial Impression / Assessment and Plan / ED Course  I have reviewed the triage vital signs and the nursing notes.  Pertinent labs & imaging results that were available during my care of the patient were reviewed by me and considered in my medical decision making (see chart for details).     42 year old female with worsening neurologic symptoms with known severe spinal stenosis.  Discussed with neurosurgery.  They state that they will see her in the office tomorrow afternoon.  Patient advised.  Continue previous recommendations. Final Clinical Impressions(s) / ED Diagnoses   Final diagnoses:  Spinal stenosis, cervical region    ED Discharge Orders    None       Raeford RazorKohut, Tadeo Besecker, MD 01/24/18 [phone]

## 2018-01-19 NOTE — Discharge Instructions (Signed)
The neurosurgeons want to see you in their office at 1pm tomorrow.

## 2018-01-30 ENCOUNTER — Other Ambulatory Visit: Payer: Self-pay | Admitting: Neurosurgery

## 2018-02-11 ENCOUNTER — Encounter (HOSPITAL_COMMUNITY): Payer: Self-pay

## 2018-02-11 NOTE — Pre-Procedure Instructions (Signed)
Alexsandria Billett [facility]  02/11/2018      Your procedure is scheduled on January, 28, 2020.  Report to [facility] Admitting at 10:00 A.M.  Call this number if you have problems the morning of surgery:  [phone]   Remember:  Do not eat or drink after midnight.    Take these medicines the morning of surgery with A SIP OF WATER: NONE  7 days prior to surgery STOP taking any Aspirin (unless otherwise instructed by your surgeon), Aleve, Naproxen, Ibuprofen, Motrin, Advil, Goody's, BC's, all herbal medications, fish oil, and all vitamins, Garcina Cambogia-Chromium.  NO SMOKING 24 hours prior to surgery.    Do not wear jewelry, make-up or nail polish.  Do not wear lotions, powders, or perfumes, or deodorant.  Do not shave 48 hours prior to surgery.   Do not bring valuables to the hospital.  [facility] is not responsible for any belongings or valuables.  Contacts, dentures or bridgework may not be worn into surgery.  Leave your suitcase in the car.  After surgery it may be brought to your room.  For patients admitted to the hospital, discharge time will be determined by your treatment team.  Patients discharged the day of surgery will not be allowed to drive home.   Special instructions:   [facility]- Preparing For Surgery  Before surgery, you can play an important role. Because skin is not sterile, your skin needs to be as free of germs as possible. You can reduce the number of germs on your skin by washing with CHG (chlorahexidine gluconate) Soap before surgery.  CHG is an antiseptic cleaner which kills germs and bonds with the skin to continue killing germs even after washing.    Oral Hygiene is also important to reduce your risk of infection.  Remember - BRUSH YOUR TEETH THE MORNING OF SURGERY WITH YOUR REGULAR TOOTHPASTE  Please do not use if you have an allergy to CHG or antibacterial soaps. If your skin becomes reddened/irritated stop using the CHG.  Do not  shave (including legs and underarms) for at least 48 hours prior to first CHG shower. It is OK to shave your face.  Please follow these instructions carefully.   1. Shower the NIGHT BEFORE SURGERY and the MORNING OF SURGERY with CHG.   2. If you chose to wash your hair, wash your hair first as usual with your normal shampoo.  3. After you shampoo, rinse your hair and body thoroughly to remove the shampoo.  4. Use CHG as you would any other liquid soap. You can apply CHG directly to the skin and wash gently with a scrungie or a clean washcloth.   5. Apply the CHG Soap to your body ONLY FROM THE NECK DOWN.  Do not use on open wounds or open sores. Avoid contact with your eyes, ears, mouth and genitals (private parts). Wash Face and genitals (private parts)  with your normal soap.  6. Wash thoroughly, paying special attention to the area where your surgery will be performed.  7. Thoroughly rinse your body with warm water from the neck down.  8. DO NOT shower/wash with your normal soap after using and rinsing off the CHG Soap.  9. Pat yourself dry with a CLEAN TOWEL.  10. Wear CLEAN PAJAMAS to bed the night before surgery, wear comfortable clothes the morning of surgery  11. Place CLEAN SHEETS on your bed the night of your first shower and DO NOT SLEEP WITH PETS.  Day of Surgery:  Do not apply any deodorants/lotions.  Please wear clean clothes to the hospital/surgery center.   Remember to brush your teeth WITH YOUR REGULAR TOOTHPASTE.    Please read over the following fact sheets that you were given.

## 2018-02-12 ENCOUNTER — Inpatient Hospital Stay (HOSPITAL_COMMUNITY)
Admission: RE | Admit: 2018-02-12 | Discharge: 2018-02-12 | Disposition: A | Discharge status: Home / Self Care | Payer: Self-pay | Source: Ambulatory Visit

## 2018-02-12 NOTE — Progress Notes (Signed)
Pt late for PAT appt, attempted to call pt x2, no answer.  Called and left msg for Erie NoeVanessa at Dr. Lindalou HosePool's office requesting if Dr. Jordan LikesPool wanted UDS day of surgery?  Spoke with Dr. Mal AmabileBrock regarding pt's history of cocaine--anesthesia would eval DOS or surgeon could order UDS if suspected recent cocaine upon arrival DOS.

## 2018-02-18 ENCOUNTER — Inpatient Hospital Stay (HOSPITAL_COMMUNITY): Admission: RE | Admit: 2018-02-18 | Payer: Medicaid Other | Source: Home / Self Care | Admitting: Neurosurgery

## 2018-02-18 ENCOUNTER — Encounter (HOSPITAL_COMMUNITY): Admission: RE | Payer: Self-pay | Source: Home / Self Care

## 2018-02-18 SURGERY — ANTERIOR CERVICAL DECOMPRESSION/DISCECTOMY FUSION 2 LEVELS
Anesthesia: General

## 2019-04-15 ENCOUNTER — Emergency Department (HOSPITAL_COMMUNITY)
Admission: EM | Admit: 2019-04-15 | Discharge: 2019-04-15 | Disposition: A | Discharge status: Home / Self Care | Payer: Medicaid Other | Attending: Emergency Medicine | Admitting: Emergency Medicine

## 2019-04-15 ENCOUNTER — Other Ambulatory Visit: Payer: Self-pay

## 2019-04-15 ENCOUNTER — Encounter (HOSPITAL_COMMUNITY): Payer: Self-pay | Admitting: Emergency Medicine

## 2019-04-15 DIAGNOSIS — M5441 Lumbago with sciatica, right side: Secondary | ICD-10-CM | POA: Insufficient documentation

## 2019-04-15 DIAGNOSIS — Z79899 Other long term (current) drug therapy: Secondary | ICD-10-CM | POA: Insufficient documentation

## 2019-04-15 DIAGNOSIS — M5489 Other dorsalgia: Secondary | ICD-10-CM | POA: Diagnosis present

## 2019-04-15 DIAGNOSIS — F17210 Nicotine dependence, cigarettes, uncomplicated: Secondary | ICD-10-CM | POA: Diagnosis not present

## 2019-04-15 DIAGNOSIS — M4802 Spinal stenosis, cervical region: Secondary | ICD-10-CM | POA: Insufficient documentation

## 2019-04-15 MED ORDER — IBUPROFEN 400 MG PO TABS
600.0000 mg | ORAL_TABLET | Freq: Once | ORAL | Status: AC
  Administered 2019-04-15: 600 mg via ORAL
  Filled 2019-04-15: qty 1

## 2019-04-15 NOTE — ED Triage Notes (Signed)
Pt has chronic back pain and was diagnosed with cervical stenosis. Pt is now having back in entire back and into right hip. Pt is ambulatory, denies saddle numbness.

## 2019-04-15 NOTE — ED Provider Notes (Signed)
April Knight [facility] EMERGENCY DEPARTMENT Provider Note   CSN: [id number] Arrival date & time: 04/15/19  1231     History Chief Complaint  Patient presents with  . Back Pain    CLOEE Knight is a 44 y.o. female with history of severe cervical stenosis and substance abuse (cocaine, ETOH) presents with low back pain. She was last seen in the ED in Dec 2019 for L arm numbness, R hand numbness, balance problems. She had a MRI of her brain and C-spine which showed severe stenosis at C5-C6 with cord edema and impingement. Neurosurgery was consulted and was given steroids and advised f/u with Neurosurgery. The patient was scheduled for ACDF of C3-C4, C5-C6 but no-showed for her pre-anesthesia appt. She states that she was concerned about the surgery because she was afraid that she would have surgery and not walk again and she is scared of COVID. She states that she is still not interested in having surgery. Her symptoms have been relatively stable over the past year. She has taken various supplements to help with her symptoms and did physical therapy which seemed to help as well. She presents today because over the past couple of days she has noted some low back pain and the pain seems to be "different". The pain has started to radiate to her right hip. She continues to have left arm numbness/weakness She denies fevers, saddle anesthesia, bowel/bladder incontinence, inability to walk. She has not made another appt with neurosurgery yet.  HPI     History reviewed. No pertinent past medical history.  Patient Active Problem List   Diagnosis Date Noted  . S/P C-section 05/22/2013  . Ventricular septal defect, fetal, affecting care of mother, antepartum 03/25/2013  . Supervision of high risk pregnancy in third trimester 03/24/2013  . Advanced maternal age (AMA) in pregnancy 03/17/2013  . Anemia 03/17/2013  . Late prenatal care 03/17/2013  . Drug use complicating pregnancy  03/17/2013  . Previous cesarean delivery, antepartum condition or complication 01/02/2013    Past Surgical History:  Procedure Laterality Date  . CESAREAN SECTION  1995,2004,2007,2009    x 4  . CESAREAN SECTION N/A 05/22/2013   Procedure: CESAREAN SECTION;  Surgeon: Catalina Antigua, MD;  Location: [facility];  Service: Obstetrics;  Laterality: N/A;     OB History    Gravida  6   Para  5   Term  4   Preterm  1   AB  1   Living  5     SAB      TAB  1   Ectopic      Multiple      Live Births  5           No family history on file.  Social History   Tobacco Use  . Smoking status: Current Every Day Smoker    Packs/day: 0.25    Years: 10.00    Pack years: 2.50    Types: Cigarettes  . Smokeless tobacco: Never Used  Substance Use Topics  . Alcohol use: Yes  . Drug use: Yes    Types: Cocaine, Marijuana    Home Medications Prior to Admission medications   Medication Sig Start Date End Date Taking? Authorizing Provider  GARCINIA CAMBOGIA-CHROMIUM PO Take 3 capsules by mouth 3 (three) times daily with meals.    [provider]  ibuprofen (ADVIL,MOTRIN) 200 MG tablet Take 400-800 mg by mouth every 6 (six) hours as needed (pain).    [provider]  ibuprofen (ADVIL,MOTRIN) 600 MG tablet Take 1 tablet (600 mg total) by mouth every 6 (six) hours. Patient not taking: Reported on 01/14/2018 05/25/13   Allen Norris, MD  predniSONE (DELTASONE) 10 MG tablet Take 4 tablets once a day for 3 days. Take 3 tablets once a day for the next 3 days. Take 2 tablets once a day for the next 3 days. Take 1 tablet once a day for the next 3 days. Patient taking differently: Take 10-40 mg by mouth See admin instructions. Take 4 tablets (40 mg) by mouth daily for 3 days, then take 3 tablets (30 mg) daily for 3 days, then take 2 tablets (20 mg) daily for 3 days, then take 1 tablet (10 mg) daily for 3 days, then stop 01/14/18   Maudie Flakes, MD  Prenatal Vit-Fe  Fumarate-FA (PRENATAL MULTIVITAMIN) TABS tablet Take 1 tablet by mouth daily at 12 noon. Patient not taking: Reported on 01/14/2018 05/25/13   Allen Norris, MD    Allergies    Patient has no known allergies.  Review of Systems   Review of Systems  Constitutional: Negative for fever.  Musculoskeletal: Positive for back pain and gait problem. Negative for neck pain.  Neurological: Positive for weakness and numbness.    Physical Exam Updated Vital Signs BP 106/81 (BP Location: Left Arm)   Pulse 68   Temp 98.8 F (37.1 C) (Oral)   Resp 15   Ht 5\' 6"  (1.676 m)   Wt 90.7 kg   SpO2 99%   BMI 32.28 kg/m   Physical Exam Vitals and nursing note reviewed.  Constitutional:      General: She is not in acute distress.    Appearance: She is well-developed. She is obese. She is not ill-appearing.     Comments: Calm and cooperative  HENT:     Head: Normocephalic and atraumatic.  Eyes:     General: No scleral icterus.       Right eye: No discharge.        Left eye: No discharge.     Conjunctiva/sclera: Conjunctivae normal.     Pupils: Pupils are equal, round, and reactive to light.  Cardiovascular:     Rate and Rhythm: Normal rate.  Pulmonary:     Effort: Pulmonary effort is normal. No respiratory distress.  Abdominal:     General: There is no distension.  Musculoskeletal:     Cervical back: Normal range of motion.     Comments: Back: Inspection: No masses, deformity, or rash Palpation: Mild upper lumbar midline spinal tenderness. No paraspinal muscle tenderness. Strength: 4/5 in LUE. 5/5 in RUE. 5/5 in lower extremities and normal plantar and dorsiflexion Sensation: Intact sensation with light touch in lower extremities bilaterally. Sensation is decreased in LUE Reflexes: Patellar reflex is 2+ bilaterally SLR: Negative seated straight leg raise Gait: Normal gait   Skin:    General: Skin is warm and dry.  Neurological:     Mental Status: She is alert and oriented to  person, place, and time.  Psychiatric:        Behavior: Behavior normal.     ED Results / Procedures / Treatments   Labs (all labs ordered are listed, but only abnormal results are displayed) Labs Reviewed - No data to display  EKG None  Radiology No results found.  Procedures Procedures (including critical care time)  Medications Ordered in ED Medications - No data to display  ED Course  I have reviewed  the triage vital signs and the nursing notes.  Pertinent labs & imaging results that were available during my care of the patient were reviewed by me and considered in my medical decision making (see chart for details).  44 year old female presents with acute low back pain.  Patient has had chronic numbness and weakness of the left arm and difficulty walking for the past year.  This has essentially been stable up until recently.  Now she is having new low back pain radiating to the right hip.  There are no red flags in her history or on exam.  She has intact distal reflexes and normal strength in her lower extremities.  She is ambulatory without difficulty.  She is requesting MRI of her entire spine today to "check on things".  Discussed with Dr. Anitra Lauth.  At this time we do not feel that MRI is warranted since symptoms are chronic and there are no new red flags.  Patient was given ibuprofen and feels a lot better.  She was encouraged to make an appointment with Dr. Jordan Likes and she is agreeable to this.  She was given a prescription for ibuprofen.  MDM Rules/Calculators/A&P                       Final Clinical Impression(s) / ED Diagnoses Final diagnoses:  Cervical stenosis of spine  Acute right-sided low back pain with right-sided sciatica    Rx / DC Orders ED Discharge Orders    None       Bethel Born, PA-C 04/15/19 1432    Gwyneth Sprout, MD 04/15/19 [phone]

## 2019-04-15 NOTE — ED Notes (Signed)
Pt up for discharge, pt left unit prior to this nurse reviewing discharge summary.

## 2019-04-15 NOTE — Discharge Instructions (Signed)
Take Ibuprofen 600mg  every 6-8 hours with food for pain Please make a follow up appointment Dr. 

## 2020-01-13 IMAGING — MR MR CERVICAL SPINE W/O CM
10 of 16 series · 28 of 48 positions shown · IV contrast (Yes)
Comparison: None available.

CLINICAL DATA: Initial evaluation for left arm numbness and
abdominal numbness for approximately 1 month, balance difficulty.

EXAM:
MRI HEAD WITHOUT CONTRAST
MRI CERVICAL SPINE WITHOUT CONTRAST
TECHNIQUE: Multiplanar, multiecho pulse sequences of the brain and surrounding
structures, and cervical spine, to include the craniocervical
junction and cervicothoracic junction, were obtained without
intravenous contrast.

[Series 3: DWI · axial · 3.0mm · 1.09mm/px · z∈[-38,+120]mm · 8 of 108 slices shown (1 of 2)]
[im 1/108]
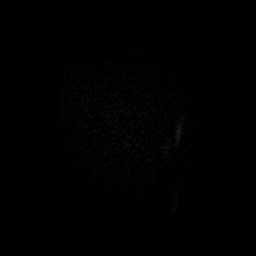
[im 14/108]
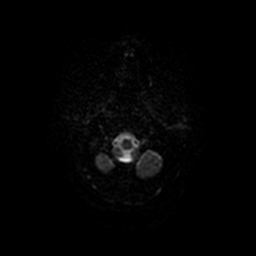
[im 27/108]
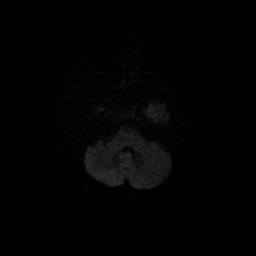
[im 41/108]
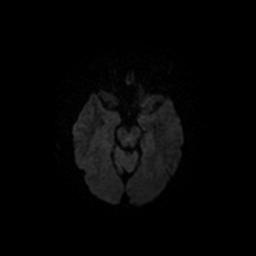
[im 67/108]
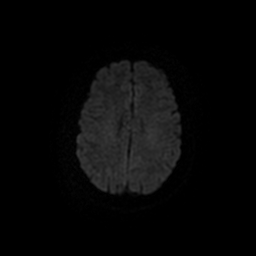
[im 81/108]
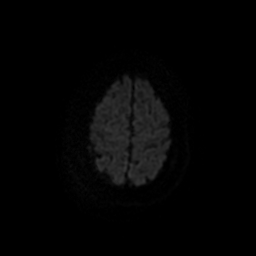
[im 94/108]
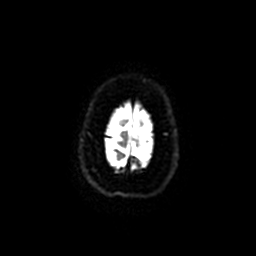
[im 108/108]
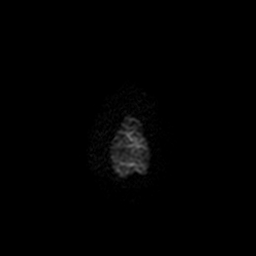

[Series 4: T1 · sagittal · 5.0mm · 0.47mm/px · 2 of 24 slices shown (1 of 3)]
[im 1/24]
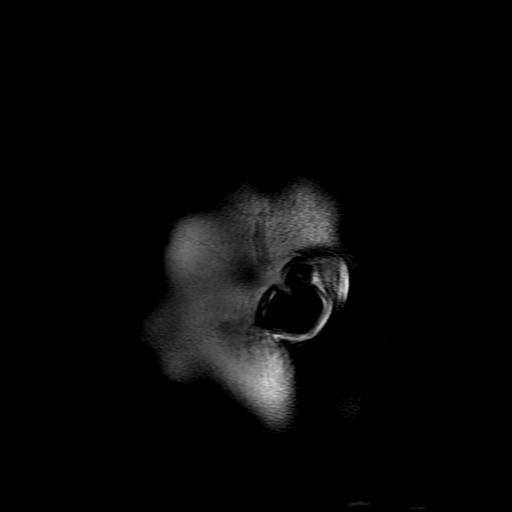
[im 24/24]
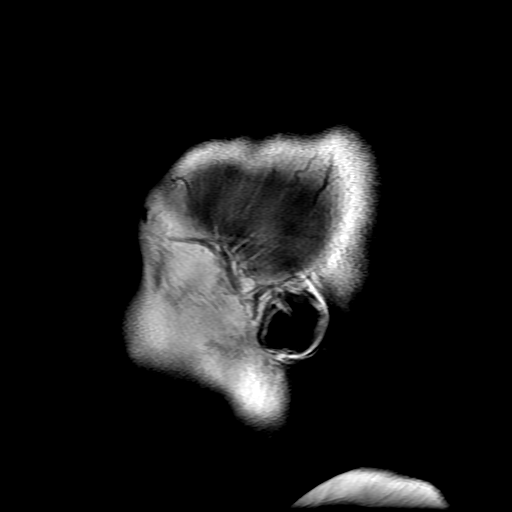

[Series 5: DWI · coronal · 3.0mm · 1.09mm/px · 8 of 108 slices shown (2 of 2)]
[im 1/108]
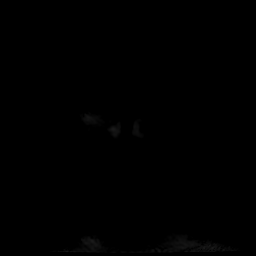
[im 16/108]
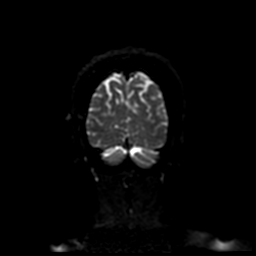
[im 31/108]
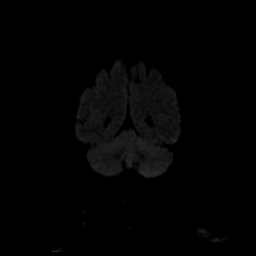
[im 46/108]
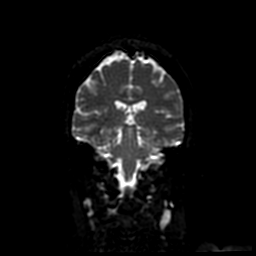
[im 62/108]
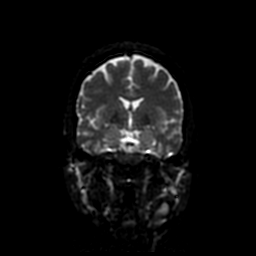
[im 77/108]
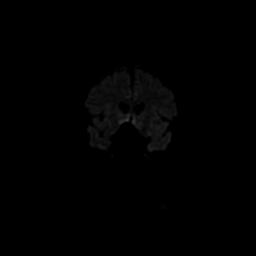
[im 92/108]
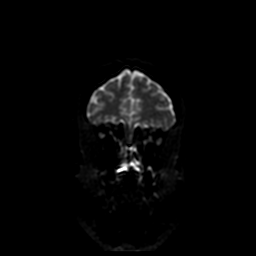
[im 108/108]
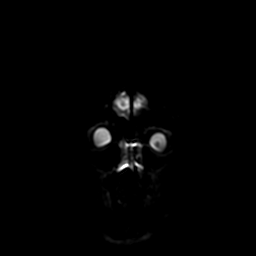

[Series 6: T2 · axial · 5.0mm · 0.43mm/px · z∈[-41,+114]mm · 2 of 27 slices shown (1 of 4)]
[im 1/27]
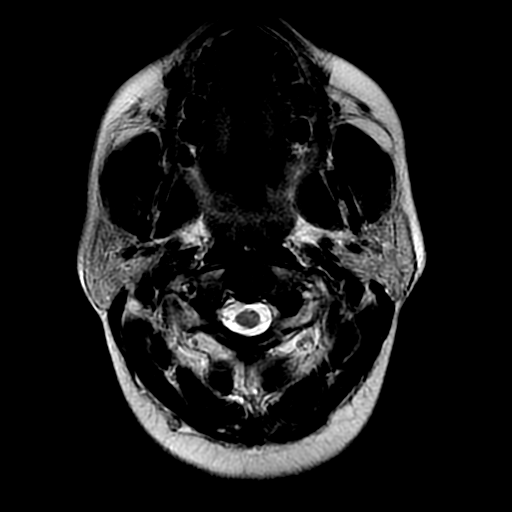
[im 27/27]
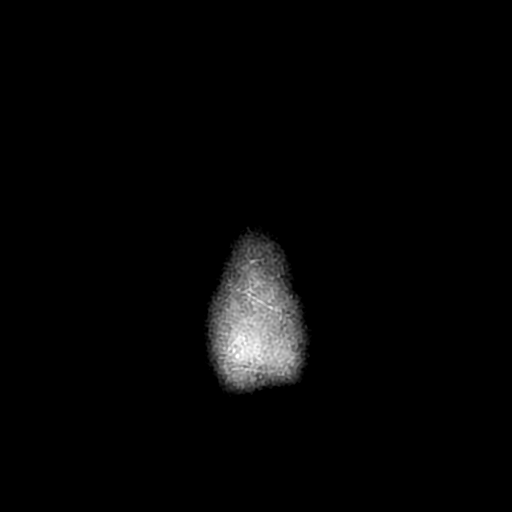

[Series 7: FLAIR · axial · 5.0mm · 0.43mm/px · 1 of 27 slices shown]
[im 1/27]
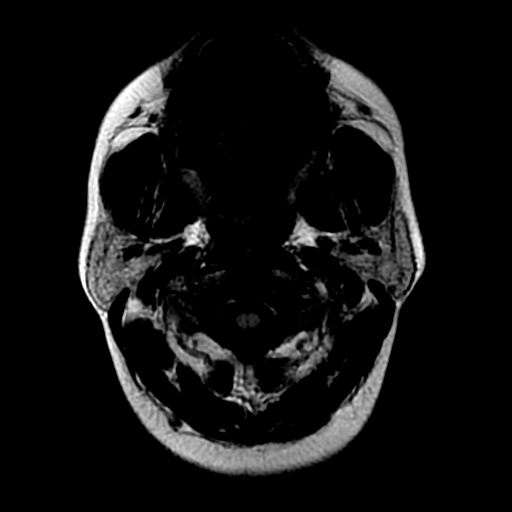

[Series 10: T2 · coronal · 5.0mm · 0.45mm/px · 2 of 27 slices shown (2 of 4)]
[im 1/27]
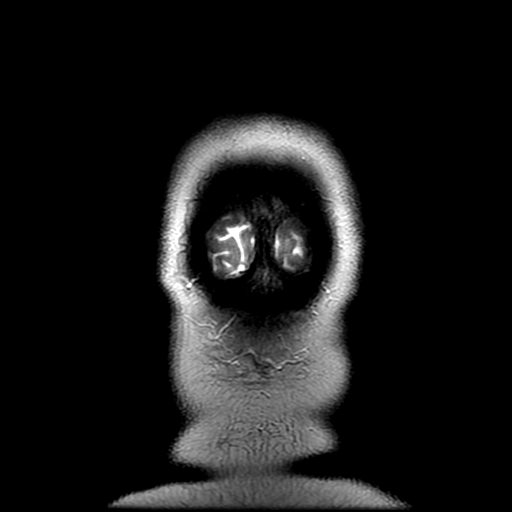
[im 27/27]
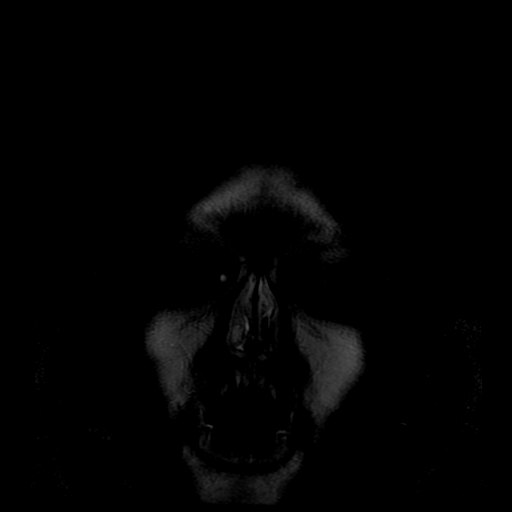

[Series 12: T1 · sagittal · 3.0mm · 0.41mm/px · 1 of 12 slices shown (2 of 3)]
[im 1/12]
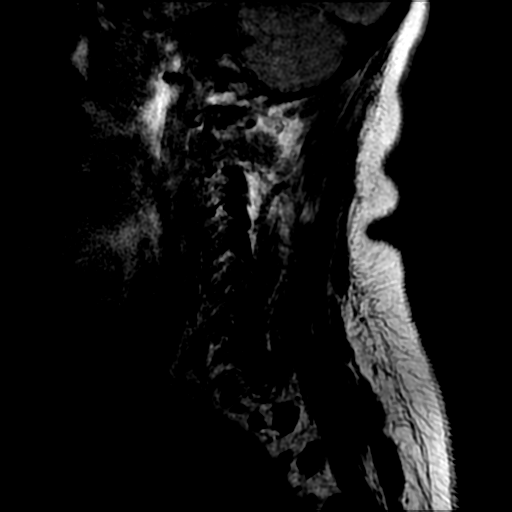

[Series 13: T2 · sagittal · 3.0mm · 0.41mm/px · 1 of 12 slices shown (3 of 4)]
[im 1/12]
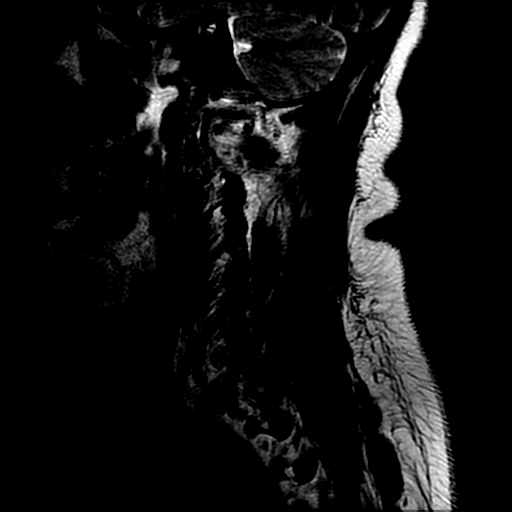

[Series 16: T2 · axial · 3.1mm · 0.35mm/px · z∈[-64,+41]mm · 2 of 31 slices shown (4 of 4)]
[im 1/31]
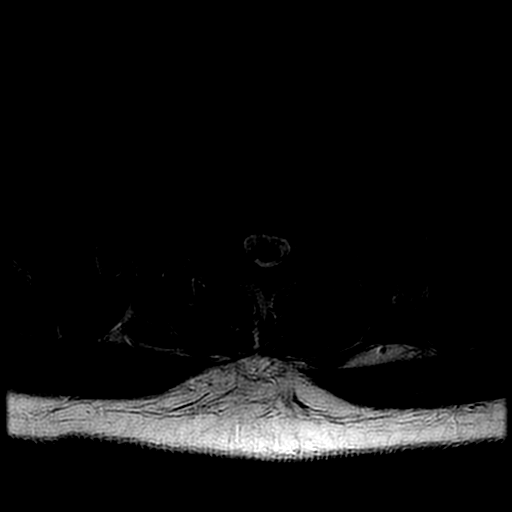
[im 31/31]
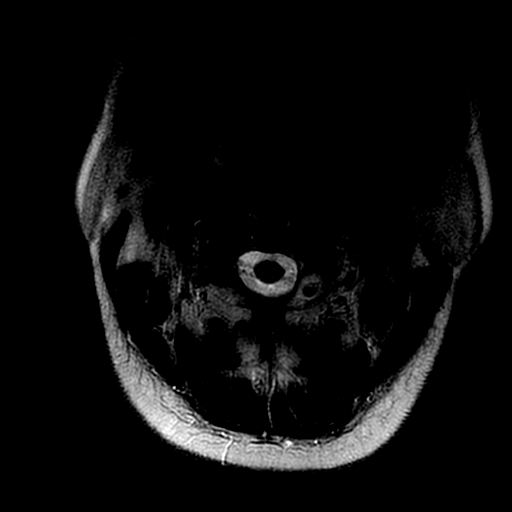

[Series 17: T1 · sagittal · 3.0mm · 0.82mm/px · 1 of 12 slices shown (3 of 3)]
[im 1/12]
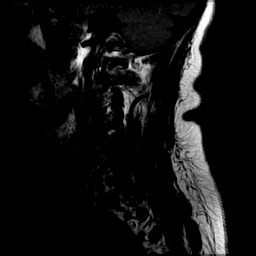

[28 of 48 positions shown; findings below may reference images not displayed]

FINDINGS: MRI HEAD FINDINGS

Brain: Examination mildly degraded by motion artifact.

Cerebral volume within normal limits for age. No focal parenchymal
signal abnormality identified. No significant cerebral white matter
disease. No abnormal foci of restricted diffusion to suggest acute
or subacute ischemia. Gray-white matter differentiation maintained.
No encephalomalacia to suggest chronic infarction. No foci of
susceptibility artifact to suggest acute or chronic intracranial
hemorrhage.

No mass lesion, midline shift or mass effect. No hydrocephalus. No
extra-axial fluid collection. Pituitary gland and suprasellar region
within normal limits. Midline structures intact and normal.

Vascular: Major intracranial vascular flow voids well maintained.

Skull and upper cervical spine: Craniocervical junction within
normal limits. No focal marrow replacing lesion. Scalp soft tissues
unremarkable.

Sinuses/Orbits: Globes and orbital soft tissues within normal
limits. Moderate opacity noted within the posterior left ethmoidal
air cells and left sphenoid sinus. Paranasal sinuses are otherwise
largely clear. No mastoid effusion. Inner ear structures normal.

Other: None.

MRI CERVICAL SPINE FINDINGS

Alignment: Examination degraded by motion artifact.

Straightening with reversal of the normal upper cervical lordosis,
apex at C3-4. No listhesis.

Vertebrae: Vertebral body heights maintained without evidence for
acute or chronic fracture. Bone marrow signal intensity within
normal limits. No discrete or worrisome osseous lesions. No abnormal
marrow edema.

Cord: Abnormal cord signal abnormality within the cervical spinal
cord at the level of C5-6 (series 14, image 7), most likely
reflecting edema and/or myelomalacia related to severe stenosis at
this level. Signal intensity within the cervical spinal cord
otherwise normal.

Posterior Fossa, vertebral arteries, paraspinal tissues:
Craniocervical junction within normal limits. Mildly prominent
bilateral level II lymph nodes noted, measuring up to 12 mm on the
left and 10 mm on the right, of uncertain significance, but could be
reactive. Paraspinous soft tissues demonstrate no other acute
finding. Normal intravascular flow voids maintained within the
vertebral arteries bilaterally.

Disc levels:

C2-C3: Unremarkable.

C3-C4: Circumferential disc osteophyte with intervertebral disc
space narrowing. Broad posterior component indents and effaces the
ventral thecal sac. Resultant moderate spinal stenosis with
flattening of the cervical spinal cord. Foramina remain patent.

C4-C5: Mild diffuse disc bulge with bilateral uncovertebral
hypertrophy. Resultant mild spinal stenosis without significant cord
deformity. Mild right C5 foraminal stenosis.

C5-C6: Right eccentric disc osteophyte complex effaces the ventral
CSF, impinging upon the ventral spinal cord. Resultant severe spinal
stenosis with cord flattening and cord signal changes (series 16,
image 22). Thecal sac measures 5-6 mm in AP diameter at its most
narrow point. Moderate to severe right with moderate left C6
foraminal narrowing.

C6-C7: Mild diffuse disc bulge with uncovertebral hypertrophy. Mild
ligamentum flavum thickening. Resultant mild spinal stenosis without
cord deformity. Mild bilateral C7 foraminal narrowing.

C7-T1:  Minimal facet hypertrophy.  No stenosis.

Visualized upper thoracic spine demonstrates no significant finding.
IMPRESSION: MRI HEAD IMPRESSION:

1. Normal brain MRI.  No acute intracranial abnormality identified.
2. Left ethmoidal and sphenoid sinusitis.

MRA HEAD IMPRESSION:

1. Right eccentric disc osteophyte complex at C5-6 with resultant
severe spinal stenosis and cord impingement. Associated cord signal
abnormality suggestive of edema and/or myelomalacia. Moderate to
severe right with moderate left C5 foraminal narrowing at this level
as well. Neuro surgical consultation recommended.
2. Central disc osteophyte at C3-4 with resultant moderate spinal
stenosis.
3. Mild disc bulging at C4-5 and C6-7 with resultant mild spinal
stenosis.

Critical Value/emergent results were called by telephone at the time
of interpretation on 01/14/2018 at [DATE] to Dr. KUZHINA OLCAYALP ,
who verbally acknowledged these results.

## 2020-03-28 ENCOUNTER — Encounter (HOSPITAL_COMMUNITY): Payer: Self-pay

## 2020-03-28 ENCOUNTER — Ambulatory Visit (HOSPITAL_COMMUNITY)
Admission: EM | Admit: 2020-03-28 | Discharge: 2020-03-28 | Disposition: A | Discharge status: Home / Self Care | Payer: Medicaid Other | Attending: Family Medicine | Admitting: Family Medicine

## 2020-03-28 ENCOUNTER — Other Ambulatory Visit: Payer: Self-pay

## 2020-03-28 DIAGNOSIS — J3089 Other allergic rhinitis: Secondary | ICD-10-CM | POA: Diagnosis not present

## 2020-03-28 DIAGNOSIS — L0100 Impetigo, unspecified: Secondary | ICD-10-CM

## 2020-03-28 DIAGNOSIS — R21 Rash and other nonspecific skin eruption: Secondary | ICD-10-CM

## 2020-03-28 MED ORDER — MUPIROCIN 2 % EX OINT: 1.0000 "application " | TOPICAL_OINTMENT | Freq: Two times a day (BID) | CUTANEOUS | 0 refills | Status: DC

## 2020-03-28 MED ORDER — PERMETHRIN 5 % EX CREA: TOPICAL_CREAM | CUTANEOUS | 0 refills | Status: DC

## 2020-03-28 MED ORDER — CETIRIZINE HCL 10 MG PO TABS: 10.0000 mg | ORAL_TABLET | Freq: Every day | ORAL | 2 refills | Status: DC

## 2020-03-28 NOTE — ED Triage Notes (Signed)
Pt reports swelling, blisters and pain to touch in the nose x 2 month. States she think having "parasites" after trimmed the hair in the nose.

## 2020-03-28 NOTE — ED Provider Notes (Signed)
[facility]    CSN: [id number] Arrival date & time: 03/28/20  1013      History   Chief Complaint Chief Complaint  Patient presents with  . Nose Problem    HPI April Knight is a 45 y.o. female.   Patient here today with complaints of 2 months of intermittent episodes of painful crusting and sores within her nostrils, worse on the left side.  She states it seems to be worse after she trims the hairs in her nose.  She notes chronic congestion from seasonal allergies and does not take anything for this but this has not been worse since onset of symptoms.  Denies fever, chills, blocked nasal passages, known chronic skin issues.  She fears she has a parasite issue.  She also notes numerous areas across her body that have been very itchy and have linear scabs on them.  Has not tried anything over-the-counter for these areas.     History reviewed. No pertinent past medical history.  Patient Active Problem List   Diagnosis Date Noted  . S/P C-section 05/22/2013  . Ventricular septal defect, fetal, affecting care of mother, antepartum 03/25/2013  . Supervision of high risk pregnancy in third trimester 03/24/2013  . Advanced maternal age (AMA) in pregnancy 03/17/2013  . Anemia 03/17/2013  . Late prenatal care 03/17/2013  . Drug use complicating pregnancy 03/17/2013  . Previous cesarean delivery, antepartum condition or complication 01/02/2013    Past Surgical History:  Procedure Laterality Date  . CESAREAN SECTION  1995,2004,2007,2009    x 4  . CESAREAN SECTION N/A 05/22/2013   Procedure: CESAREAN SECTION;  Surgeon: Catalina Antigua, MD;  Location: [facility];  Service: Obstetrics;  Laterality: N/A;    OB History    Gravida  6   Para  5   Term  4   Preterm  1   AB  1   Living  5     SAB      IAB  1   Ectopic      Multiple      Live Births  5            Home Medications    Prior to Admission medications   Medication Sig Start Date End  Date Taking? Authorizing Provider  cetirizine (ZYRTEC ALLERGY) 10 MG tablet Take 1 tablet (10 mg total) by mouth daily. 03/28/20  Yes Particia Nearing, PA-C  mupirocin ointment (BACTROBAN) 2 % Apply 1 application topically 2 (two) times daily. Apply to inside of nose and surrounding area twice daily until resolved 03/28/20  Yes Maurice March, Salley Hews, PA-C  permethrin (ELIMITE) 5 % cream Apply to whole body once, leave on for 8 hours, rinse off in shower 03/28/20  Yes Maurice March, Salley Hews, PA-C  Flaxseed, Linseed, (FLAX SEEDS) POWD Take 1 Scoop by mouth daily. Flax Seed Powder    [provider]  OVER THE COUNTER MEDICATION Take 4 capsules by mouth daily. Red Statistician, Historical, MD  OVER THE COUNTER MEDICATION Take 1 Scoop by mouth daily. Neem Powder    [provider]  OVER THE COUNTER MEDICATION Take 1 capsule by mouth daily. [employer], Historical, MD  predniSONE (DELTASONE) 10 MG tablet Take 4 tablets once a day for 3 days. Take 3 tablets once a day for the next 3 days. Take 2 tablets once a day for the next 3 days. Take 1 tablet once a day for  the next 3 days. Patient not taking: No sig reported 01/14/18   Sabas Sous, MD  TURMERIC PO Take 1 capsule by mouth daily. Turmeric Capsule    [provider]    Family History History reviewed. No pertinent family history.  Social History Social History   Tobacco Use  . Smoking status: Current Every Day Smoker    Packs/day: 0.25    Years: 10.00    Pack years: 2.50    Types: Cigarettes  . Smokeless tobacco: Never Used  Vaping Use  . Vaping Use: Never used  Substance Use Topics  . Alcohol use: Yes  . Drug use: Not Currently    Types: Cocaine, Marijuana     Allergies   Patient has no known allergies.   Review of Systems Review of Systems Per HPI Physical Exam Triage Vital Signs ED Triage Vitals  Enc Vitals Group     BP 03/28/20 1212 119/78     Pulse Rate  03/28/20 1212 82     Resp 03/28/20 1212 20     Temp 03/28/20 1212 97.7 F (36.5 C)     Temp Source 03/28/20 1212 Oral     SpO2 03/28/20 1212 100 %     Weight --      Height --      Head Circumference --      Peak Flow --      Pain Score 03/28/20 1210 8     Pain Loc --      Pain Edu? --      Excl. in GC? --    No data found.  Updated Vital Signs BP 119/78 (BP Location: Right Arm)   Pulse 82   Temp 97.7 F (36.5 C) (Oral)   Resp 20   LMP  (Within Weeks) Comment: 3 weeks  SpO2 100%   Visual Acuity Right Eye Distance:   Left Eye Distance:   Bilateral Distance:    Right Eye Near:   Left Eye Near:    Bilateral Near:     Physical Exam Vitals and nursing note reviewed.  Constitutional:      Appearance: Normal appearance. She is not ill-appearing.  HENT:     Head: Atraumatic.     Nose: Rhinorrhea present.     Mouth/Throat:     Mouth: Mucous membranes are moist.     Pharynx: Posterior oropharyngeal erythema present.  Eyes:     Extraocular Movements: Extraocular movements intact.     Conjunctiva/sclera: Conjunctivae normal.  Cardiovascular:     Rate and Rhythm: Normal rate and regular rhythm.     Heart sounds: Normal heart sounds.  Pulmonary:     Effort: Pulmonary effort is normal.     Breath sounds: Normal breath sounds.  Abdominal:     General: Bowel sounds are normal. There is no distension.     Palpations: Abdomen is soft.     Tenderness: There is no abdominal tenderness. There is no guarding.  Musculoskeletal:        General: Normal range of motion.     Cervical back: Normal range of motion and neck supple.  Skin:    General: Skin is warm.     Findings: Lesion and rash present.     Comments: Bilateral upper extremities and lower extremities with large patches of linear tracting and scratch marks that have been scabbed over, similar areas to back upper buttock and abdomen Golden crusted lesions within bilateral nostrils worse on the left  Neurological:  Mental Status: She is alert and oriented to person, place, and time.  Psychiatric:        Mood and Affect: Mood normal.        Thought Content: Thought content normal.        Judgment: Judgment normal.     UC Treatments / Results  Labs (all labs ordered are listed, but only abnormal results are displayed) Labs Reviewed - No data to display  EKG   Radiology No results found.  Procedures Procedures (including critical care time)  Medications Ordered in UC Medications - No data to display  Initial Impression / Assessment and Plan / UC Course  I have reviewed the triage vital signs and the nursing notes.  Pertinent labs & imaging results that were available during my care of the patient were reviewed by me and considered in my medical decision making (see chart for details).     SPECT impetigo to be because of her nasal lesions.  We will treat with mupirocin ointment, keep area clean and protected. Other areas of rash fairly consistent with scabies so will trial permethrin cream and monitor for benefit with this.  Discussed proper use of this.  We will also start Zyrtec daily to help get her allergy symptoms under control. Final Clinical Impressions(s) / UC Diagnoses   Final diagnoses:  Rash  Impetigo  Non-seasonal allergic rhinitis due to other allergic trigger   Discharge Instructions   None    ED Prescriptions    Medication Sig Dispense Auth. Provider   permethrin (ELIMITE) 5 % cream Apply to whole body once, leave on for 8 hours, rinse off in shower 60 g Particia Nearing, PA-C   mupirocin ointment (BACTROBAN) 2 % Apply 1 application topically 2 (two) times daily. Apply to inside of nose and surrounding area twice daily until resolved 22 g Particia Nearing, PA-C   cetirizine (ZYRTEC ALLERGY) 10 MG tablet Take 1 tablet (10 mg total) by mouth daily. 30 tablet Particia Nearing, [place]     PDMP not reviewed this encounter.   Particia Nearing,  [place] 03/28/20 1712

## 2020-04-10 ENCOUNTER — Other Ambulatory Visit: Payer: Self-pay | Admitting: Family Medicine

## 2020-04-17 ENCOUNTER — Ambulatory Visit (HOSPITAL_COMMUNITY)
Admission: EM | Admit: 2020-04-17 | Discharge: 2020-04-17 | Disposition: A | Discharge status: Home / Self Care | Payer: Medicaid Other | Attending: Family Medicine | Admitting: Family Medicine

## 2020-04-17 ENCOUNTER — Encounter (HOSPITAL_COMMUNITY): Payer: Self-pay

## 2020-04-17 ENCOUNTER — Other Ambulatory Visit: Payer: Self-pay

## 2020-04-17 DIAGNOSIS — L309 Dermatitis, unspecified: Secondary | ICD-10-CM | POA: Diagnosis not present

## 2020-04-17 DIAGNOSIS — L0100 Impetigo, unspecified: Secondary | ICD-10-CM

## 2020-04-17 DIAGNOSIS — J3089 Other allergic rhinitis: Secondary | ICD-10-CM

## 2020-04-17 MED ORDER — MUPIROCIN 2 % EX OINT: 1.0000 "application " | TOPICAL_OINTMENT | Freq: Two times a day (BID) | CUTANEOUS | 0 refills | Status: DC

## 2020-04-17 MED ORDER — CETIRIZINE HCL 10 MG PO TABS: 10.0000 mg | ORAL_TABLET | Freq: Every day | ORAL | 2 refills | Status: DC

## 2020-04-17 MED ORDER — FLUTICASONE PROPIONATE 50 MCG/ACT NA SUSP: 1.0000 | Freq: Two times a day (BID) | NASAL | 2 refills | Status: DC

## 2020-04-17 MED ORDER — DOXYCYCLINE HYCLATE 100 MG PO TABS: 100.0000 mg | ORAL_TABLET | Freq: Two times a day (BID) | ORAL | 0 refills | Status: DC

## 2020-04-17 NOTE — ED Triage Notes (Signed)
Pt presents with itchiness and blister in the nostril x 2 months.

## 2020-04-17 NOTE — ED Provider Notes (Signed)
[facility]    CSN: [id number] Arrival date & time: 04/17/20  1259      History   Chief Complaint Chief Complaint  Patient presents with  . Blister    HPI April Knight is a 45 y.o. female.   Presenting today with acute on chronic blistered ulcerated area at base of left nostril this been ongoing intermittently for about 2 months now.  She was given mupirocin here at the urgent care near the time of onset and states this did help but she lost it and the sore has come back and become worse than before.  She is chronically congested from uncontrolled allergies.  Takes Benadryl at bedtime most days which helps for a few hours.  Denies fever, chills, headache, trouble breathing.  She also states she has itchy rashes and dryness across her body worse on left side of neck and bilateral forearms.  Not trying anything for these.     History reviewed. No pertinent past medical history.  Patient Active Problem List   Diagnosis Date Noted  . S/P C-section 05/22/2013  . Ventricular septal defect, fetal, affecting care of mother, antepartum 03/25/2013  . Supervision of high risk pregnancy in third trimester 03/24/2013  . Advanced maternal age (AMA) in pregnancy 03/17/2013  . Anemia 03/17/2013  . Late prenatal care 03/17/2013  . Drug use complicating pregnancy 03/17/2013  . Previous cesarean delivery, antepartum condition or complication 01/02/2013    Past Surgical History:  Procedure Laterality Date  . CESAREAN SECTION  1995,2004,2007,2009    x 4  . CESAREAN SECTION N/A 05/22/2013   Procedure: CESAREAN SECTION;  Surgeon: Catalina Antigua, MD;  Location: [facility];  Service: Obstetrics;  Laterality: N/A;    OB History    Gravida  6   Para  5   Term  4   Preterm  1   AB  1   Living  5     SAB      IAB  1   Ectopic      Multiple      Live Births  5            Home Medications    Prior to Admission medications   Medication Sig Start Date End  Date Taking? Authorizing Provider  doxycycline (VIBRA-TABS) 100 MG tablet Take 1 tablet (100 mg total) by mouth 2 (two) times daily. 04/17/20  Yes Particia Nearing, PA-C  fluticasone [facility]) 50 MCG/ACT nasal spray Place 1 spray into both nostrils in the morning and at bedtime. 04/17/20  Yes Particia Nearing, PA-C  cetirizine (ZYRTEC ALLERGY) 10 MG tablet Take 1 tablet (10 mg total) by mouth daily. 04/17/20   Particia Nearing, PA-C  Flaxseed, Linseed, (FLAX SEEDS) POWD Take 1 Scoop by mouth daily. Flax Seed Powder    [provider]  mupirocin ointment (BACTROBAN) 2 % Apply 1 application topically 2 (two) times daily. Apply to inside of nose and surrounding area twice daily until resolved 04/17/20   Particia Nearing, PA-C  OVER THE COUNTER MEDICATION Take 4 capsules by mouth daily. Red Statistician, Historical, MD  OVER THE COUNTER MEDICATION Take 1 Scoop by mouth daily. Neem Powder    [provider]  OVER THE COUNTER MEDICATION Take 1 capsule by mouth daily. [employer], Historical, MD  permethrin (ELIMITE) 5 % cream Apply to whole body once, leave on for 8 hours, rinse off in shower  03/28/20   Particia Nearing, PA-C  predniSONE (DELTASONE) 10 MG tablet Take 4 tablets once a day for 3 days. Take 3 tablets once a day for the next 3 days. Take 2 tablets once a day for the next 3 days. Take 1 tablet once a day for the next 3 days. Patient not taking: No sig reported 01/14/18   Sabas Sous, MD  TURMERIC PO Take 1 capsule by mouth daily. Turmeric Capsule    [provider]    Family History History reviewed. No pertinent family history.  Social History Social History   Tobacco Use  . Smoking status: Current Every Day Smoker    Packs/day: 0.25    Years: 10.00    Pack years: 2.50    Types: Cigarettes  . Smokeless tobacco: Never Used  Vaping Use  . Vaping Use: Never used  Substance Use Topics  . Alcohol  use: Yes  . Drug use: Not Currently    Types: Cocaine, Marijuana     Allergies   Patient has no known allergies.   Review of Systems Review of Systems Per HPI  Physical Exam Triage Vital Signs ED Triage Vitals  Enc Vitals Group     BP 04/17/20 1316 126/77     Pulse Rate 04/17/20 1316 74     Resp 04/17/20 1316 19     Temp 04/17/20 1316 97.9 F (36.6 C)     Temp Source 04/17/20 1316 Oral     SpO2 04/17/20 1316 96 %     Weight --      Height --      Head Circumference --      Peak Flow --      Pain Score 04/17/20 1314 6     Pain Loc --      Pain Edu? --      Excl. in GC? --    No data found.  Updated Vital Signs BP 126/77 (BP Location: Right Arm)   Pulse 74   Temp 97.9 F (36.6 C) (Oral)   Resp 19   SpO2 96%   Visual Acuity Right Eye Distance:   Left Eye Distance:   Bilateral Distance:    Right Eye Near:   Left Eye Near:    Bilateral Near:     Physical Exam Vitals and nursing note reviewed.  Constitutional:      Appearance: Normal appearance. She is not ill-appearing.  HENT:     Head: Atraumatic.     Right Ear: Tympanic membrane normal.     Left Ear: Tympanic membrane normal.     Nose: Congestion and rhinorrhea present.     Comments: 1.5 cm crusted, ulcerated and draining lesion at base of left nostril    Mouth/Throat:     Mouth: Mucous membranes are moist.     Pharynx: Oropharynx is clear.  Eyes:     Extraocular Movements: Extraocular movements intact.     Conjunctiva/sclera: Conjunctivae normal.  Cardiovascular:     Rate and Rhythm: Normal rate and regular rhythm.     Heart sounds: Normal heart sounds.  Pulmonary:     Effort: Pulmonary effort is normal.     Breath sounds: Normal breath sounds.  Musculoskeletal:        General: Normal range of motion.     Cervical back: Normal range of motion and neck supple.  Skin:    General: Skin is warm and dry.     Findings: Rash present.     Comments:  Patches of dry, hyperpigmented rash left side of  neck and bilateral forearms  Neurological:     Mental Status: She is alert and oriented to person, place, and time.  Psychiatric:        Mood and Affect: Mood normal.        Thought Content: Thought content normal.        Judgment: Judgment normal.      UC Treatments / Results  Labs (all labs ordered are listed, but only abnormal results are displayed) Labs Reviewed - No data to display  EKG   Radiology No results found.  Procedures Procedures (including critical care time)  Medications Ordered in UC Medications - No data to display  Initial Impression / Assessment and Plan / UC Course  I have reviewed the triage vital signs and the nursing notes.  Pertinent labs & imaging results that were available during my care of the patient were reviewed by me and considered in my medical decision making (see chart for details).     Significant impetigo left nostril, will refill mupirocin ointment but will also start oral doxycycline given extent.  We will also start Zyrtec and Flonase regimen for significantly uncontrolled allergic rhinitis.  Triamcinolone cream given for eczema patches and itching.  Follow-up with primary care as scheduled for recheck all these things.  Final Clinical Impressions(s) / UC Diagnoses   Final diagnoses:  Impetigo  Seasonal allergic rhinitis due to other allergic trigger  Eczema, unspecified type   Discharge Instructions   None    ED Prescriptions    Medication Sig Dispense Auth. Provider   cetirizine (ZYRTEC ALLERGY) 10 MG tablet Take 1 tablet (10 mg total) by mouth daily. 30 tablet Particia Nearing, [place]   mupirocin ointment (BACTROBAN) 2 % Apply 1 application topically 2 (two) times daily. Apply to inside of nose and surrounding area twice daily until resolved 22 g Particia Nearing, PA-C   fluticasone [facility]) 50 MCG/ACT nasal spray Place 1 spray into both nostrils in the morning and at bedtime. 16 g Particia Nearing, [place]    doxycycline (VIBRA-TABS) 100 MG tablet Take 1 tablet (100 mg total) by mouth 2 (two) times daily. 14 tablet Particia Nearing, [place]     PDMP not reviewed this encounter.   Particia Nearing, [place] 04/17/20 1352

## 2020-04-28 ENCOUNTER — Other Ambulatory Visit: Payer: Self-pay | Admitting: Family Medicine

## 2020-04-28 NOTE — Telephone Encounter (Signed)
Requested medications are due for refill today.  Unknown  Requested medications are on the active medications list.  yes  Last refill. 04/17/2020  Future visit scheduled.  no  Notes to clinic.  Rx written by Roosvelt Maser.

## 2020-04-29 NOTE — Telephone Encounter (Signed)
This is not a Patient of [facility] please route accordingly .

## 2021-05-29 ENCOUNTER — Ambulatory Visit: Payer: Medicaid Other | Admitting: Family Medicine

## 2022-02-24 ENCOUNTER — Encounter (HOSPITAL_COMMUNITY): Payer: Self-pay

## 2022-02-24 ENCOUNTER — Emergency Department (HOSPITAL_COMMUNITY)
Admission: EM | Admit: 2022-02-24 | Discharge: 2022-02-24 | Disposition: A | Discharge status: Home / Self Care | Payer: Medicaid Other | Attending: Emergency Medicine | Admitting: Emergency Medicine

## 2022-02-24 ENCOUNTER — Other Ambulatory Visit: Payer: Self-pay

## 2022-02-24 DIAGNOSIS — Z202 Contact with and (suspected) exposure to infections with a predominantly sexual mode of transmission: Secondary | ICD-10-CM | POA: Diagnosis present

## 2022-02-24 LAB — WET PREP, GENITAL
Clue Cells Wet Prep HPF POC: NONE SEEN
Sperm: NONE SEEN
Trich, Wet Prep: NONE SEEN
WBC, Wet Prep HPF POC: <10 (ref <10)
Yeast Wet Prep HPF POC: NONE SEEN

## 2022-02-24 MED ORDER — METRONIDAZOLE 500 MG PO TABS: 500.0000 mg | ORAL_TABLET | Freq: Two times a day (BID) | ORAL | 0 refills | Status: DC

## 2022-02-24 MED ORDER — CEFTRIAXONE SODIUM 500 MG IJ SOLR
500.0000 mg | Freq: Once | INTRAMUSCULAR | Status: DC
  Filled 2022-02-24: qty 500

## 2022-02-24 MED ORDER — DOXYCYCLINE HYCLATE 100 MG PO CAPS: 100.0000 mg | ORAL_CAPSULE | Freq: Two times a day (BID) | ORAL | 0 refills | Status: AC

## 2022-02-24 NOTE — ED Notes (Signed)
AVS reviewed with pt prior to discharge. Pt verbalizes understanding. Vital signs refused prior to depart. Belongings with pt upon depart. Ambulatory to POV by self.

## 2022-02-24 NOTE — ED Triage Notes (Signed)
Patient reports her sexual partner tested positive for multiple stds and wants to get tested and treated.  Patient denies vaginal discharge burning with urination.

## 2022-02-24 NOTE — Discharge Instructions (Addendum)
You came today with concern for an STD.  You refused to be tested and refused treatment.  For any further STD concerns please present to the health department or your PCP as these are more appropriate places for these type of nonemergent concerns.  You can follow-up on your results online.

## 2022-02-24 NOTE — ED Provider Notes (Addendum)
[facility] Provider Note   CSN: [id number] Arrival date & time: 02/24/22  6237     History  Chief Complaint  Patient presents with   Exposure to STD    April Knight is a 47 y.o. female presenting today with concern for STDs.  She says that she is in what was supposed to be a monogamous relationship and her partner took an STD test at home and he tested positive "for like 5 of them."  She says that she knows that one of them was gonorrhea and the other is chlamydia.  She thinks that he tested negative for trichomonas.  She is not having any symptoms.   Exposure to STD       Home Medications Prior to Admission medications   Medication Sig Start Date End Date Taking? Authorizing Provider  doxycycline (VIBRAMYCIN) 100 MG capsule Take 1 capsule (100 mg total) by mouth 2 (two) times daily for 7 days. 02/24/22 03/03/22 Yes Lubna Stegeman A, PA-C  metroNIDAZOLE (FLAGYL) 500 MG tablet Take 1 tablet (500 mg total) by mouth 2 (two) times daily. 02/24/22  Yes Ledell Codrington A, PA-C  cetirizine (ZYRTEC ALLERGY) 10 MG tablet Take 1 tablet (10 mg total) by mouth daily. 04/17/20   Volney American, PA-C  Flaxseed, Linseed, (FLAX SEEDS) POWD Take 1 Scoop by mouth daily. Flax Seed Powder    [provider]  fluticasone (FLONASE) 50 MCG/ACT nasal spray Place 1 spray into both nostrils in the morning and at bedtime. 04/17/20   Volney American, PA-C  mupirocin ointment (BACTROBAN) 2 % Apply 1 application topically 2 (two) times daily. Apply to inside of nose and surrounding area twice daily until resolved 04/17/20   Lane, Lamar Take 4 capsules by mouth daily. Red Scientist, product/process development, Historical, MD  OVER THE COUNTER MEDICATION Take 1 Scoop by mouth daily. Neem Powder    [provider]  OVER THE COUNTER MEDICATION Take 1 capsule by mouth daily. [employer],  Historical, MD  permethrin (ELIMITE) 5 % cream Apply to whole body once, leave on for 8 hours, rinse off in shower 03/28/20   Volney American, PA-C  predniSONE (DELTASONE) 10 MG tablet Take 4 tablets once a day for 3 days. Take 3 tablets once a day for the next 3 days. Take 2 tablets once a day for the next 3 days. Take 1 tablet once a day for the next 3 days. Patient not taking: No sig reported 01/14/18   Maudie Flakes, MD  TURMERIC PO Take 1 capsule by mouth daily. Turmeric Capsule    [provider]      Allergies    Patient has no known allergies.    Review of Systems   Review of Systems  Physical Exam Updated Vital Signs BP (!) 162/99 (BP Location: Right Arm)   Pulse 99   Temp 97.8 F (36.6 C) (Oral)   Resp (!) 24   Ht 5\' 6"  (1.676 m)   Wt 90.7 kg   SpO2 99%   BMI 32.28 kg/m  Physical Exam Vitals and nursing note reviewed.  Constitutional:      Appearance: Normal appearance.  HENT:     Head: Normocephalic and atraumatic.  Eyes:     General: No scleral icterus.    Conjunctiva/sclera: Conjunctivae normal.  Pulmonary:     Effort: Pulmonary effort is  normal. No respiratory distress.  Skin:    Findings: No rash.  Neurological:     Mental Status: She is alert.  Psychiatric:        Mood and Affect: Mood normal.     ED Results / Procedures / Treatments   Labs (all labs ordered are listed, but only abnormal results are displayed) Labs Reviewed  WET PREP, GENITAL  GC/CHLAMYDIA PROBE AMP ([place]) NOT AT [facility]    EKG None  Radiology No results found.  Procedures Procedures   Medications Ordered in ED Medications  cefTRIAXone (ROCEPHIN) injection 500 mg (500 mg Intramuscular Given 02/24/22 0092)    ED Course/ Medical Decision Making/ A&P                             Medical Decision Making Amount and/or Complexity of Data Reviewed Labs: ordered.  Risk Prescription drug management.   47 year old female presenting today for STD  concerns.  Reports being exposed to several, she has not positive which ones but knows of chlamydia and gonorrhea.  We discussed that I would empirically treat her for these STDs.  Upon nurse trying the Rocephin shot she declined this requesting for more information.  I have already spoken to her at bedside about which antibiotics to treat different STDs.  She says that she would prefer to follow-up with her PCP.  Additionally, patient declined self swab so we will not get any STD results.  Unsure about the mode of the patient's visit but she will be discharged  Ultimately, we discussed that this is a nonemergent visit and that a better option would be going to the health department or seeing her PCP.  She voiced understanding.   Final Clinical Impression(s) / ED Diagnoses Final diagnoses:  Exposure to STD    Rx / DC Orders ED Discharge Orders          Ordered    doxycycline (VIBRAMYCIN) 100 MG capsule  2 times daily        02/24/22 0705    metroNIDAZOLE (FLAGYL) 500 MG tablet  2 times daily        02/24/22 3300           Results and diagnoses were explained to the patient. Return precautions discussed in full. Patient had no additional questions and expressed complete understanding.   This chart was dictated using voice recognition software.  Despite best efforts to proofread,  errors can occur which can change the documentation meaning.     Darliss Ridgel 02/24/22 7622   7:39am: Patient now says that she would like the swabs done.  Nursing staff is helping her self swab at bedside as a pelvic exam does not feel necessary due to her lack of symptoms.  She incorrectly did the self swab so it is being repeated.  She can follow-up on these results online and discuss treatment that she declined with her outpatient providers.   Darliss Ridgel 02/24/22 Rivka Safer, April, MD 02/24/22 2342

## 2022-02-26 LAB — GC/CHLAMYDIA PROBE AMP (~~LOC~~) NOT AT ARMC
Chlamydia: NEGATIVE
Comment: NEGATIVE
Comment: NORMAL
Neisseria Gonorrhea: NEGATIVE

## 2022-04-05 ENCOUNTER — Encounter (HOSPITAL_COMMUNITY): Payer: Self-pay | Admitting: *Deleted

## 2022-04-05 ENCOUNTER — Encounter (HOSPITAL_COMMUNITY): Payer: Self-pay

## 2022-04-05 ENCOUNTER — Other Ambulatory Visit: Payer: Self-pay

## 2022-04-05 ENCOUNTER — Emergency Department (HOSPITAL_COMMUNITY)
Admission: EM | Admit: 2022-04-05 | Discharge: 2022-04-05 | Disposition: A | Discharge status: Home / Self Care | Payer: Medicaid Other | Attending: Emergency Medicine | Admitting: Emergency Medicine

## 2022-04-05 ENCOUNTER — Ambulatory Visit (INDEPENDENT_AMBULATORY_CARE_PROVIDER_SITE_OTHER): Payer: Medicaid Other

## 2022-04-05 ENCOUNTER — Ambulatory Visit (HOSPITAL_COMMUNITY)
Admission: EM | Admit: 2022-04-05 | Discharge: 2022-04-05 | Disposition: A | Discharge status: Home / Self Care | Payer: Medicaid Other | Attending: Family Medicine | Admitting: Family Medicine

## 2022-04-05 DIAGNOSIS — R5383 Other fatigue: Secondary | ICD-10-CM | POA: Insufficient documentation

## 2022-04-05 DIAGNOSIS — R0602 Shortness of breath: Secondary | ICD-10-CM | POA: Diagnosis present

## 2022-04-05 DIAGNOSIS — Z113 Encounter for screening for infections with a predominantly sexual mode of transmission: Secondary | ICD-10-CM | POA: Insufficient documentation

## 2022-04-05 DIAGNOSIS — D649 Anemia, unspecified: Secondary | ICD-10-CM | POA: Diagnosis not present

## 2022-04-05 LAB — BRAIN NATRIURETIC PEPTIDE: B Natriuretic Peptide: 31.4 pg/mL (ref 0.0–100.0)

## 2022-04-05 LAB — CBC WITH DIFFERENTIAL/PLATELET
Abs Immature Granulocytes: 0.01 10*3/uL (ref 0.00–0.07)
Basophils Absolute: 0.1 10*3/uL (ref 0.0–0.1)
Basophils Relative: 1 %
Eosinophils Absolute: 0.3 10*3/uL (ref 0.0–0.5)
Eosinophils Relative: 4 %
HCT: 26.2 % — ABNORMAL LOW (ref 36.0–46.0)
Hemoglobin: 7.7 g/dL — ABNORMAL LOW (ref 12.0–15.0)
Immature Granulocytes: 0 %
Lymphocytes Relative: 29 %
Lymphs Abs: 2.2 10*3/uL (ref 0.7–4.0)
MCH: 22.4 pg — ABNORMAL LOW (ref 26.0–34.0)
MCHC: 29.4 g/dL — ABNORMAL LOW (ref 30.0–36.0)
MCV: 76.2 fL — ABNORMAL LOW (ref 80.0–100.0)
Monocytes Absolute: 0.6 10*3/uL (ref 0.1–1.0)
Monocytes Relative: 8 %
Neutro Abs: 4.4 10*3/uL (ref 1.7–7.7)
Neutrophils Relative %: 58 %
Platelets: 380 10*3/uL (ref 150–400)
RBC: 3.44 MIL/uL — ABNORMAL LOW (ref 3.87–5.11)
RDW: 16.6 % — ABNORMAL HIGH (ref 11.5–15.5)
WBC: 7.6 10*3/uL (ref 4.0–10.5)
nRBC: 0 % (ref 0.0–0.2)

## 2022-04-05 LAB — CBC
HCT: 26.6 % — ABNORMAL LOW (ref 36.0–46.0)
Hemoglobin: 8 g/dL — ABNORMAL LOW (ref 12.0–15.0)
MCH: 22.7 pg — ABNORMAL LOW (ref 26.0–34.0)
MCHC: 30.1 g/dL (ref 30.0–36.0)
MCV: 75.4 fL — ABNORMAL LOW (ref 80.0–100.0)
Platelets: 409 10*3/uL — ABNORMAL HIGH (ref 150–400)
RBC: 3.53 MIL/uL — ABNORMAL LOW (ref 3.87–5.11)
RDW: 16.5 % — ABNORMAL HIGH (ref 11.5–15.5)
WBC: 7.7 10*3/uL (ref 4.0–10.5)
nRBC: 0 % (ref 0.0–0.2)

## 2022-04-05 LAB — BASIC METABOLIC PANEL
Anion gap: 7 (ref 5–15)
BUN: 10 mg/dL (ref 6–20)
CO2: 26 mmol/L (ref 22–32)
Calcium: 9.1 mg/dL (ref 8.9–10.3)
Chloride: 104 mmol/L (ref 98–111)
Creatinine, Ser: 0.72 mg/dL (ref 0.44–1.00)
GFR, Estimated: >60 mL/min (ref >60)
Glucose, Bld: 109 mg/dL — ABNORMAL HIGH (ref 70–99)
Potassium: 4.1 mmol/L (ref 3.5–5.1)
Sodium: 137 mmol/L (ref 135–145)

## 2022-04-05 LAB — CBG MONITORING, ED: Glucose-Capillary: 116 mg/dL — ABNORMAL HIGH (ref 70–99)

## 2022-04-05 LAB — TYPE AND SCREEN
ABO/RH(D): O POS
Antibody Screen: NEGATIVE

## 2022-04-05 LAB — HIV ANTIBODY (ROUTINE TESTING W REFLEX): HIV Screen 4th Generation wRfx: NONREACTIVE

## 2022-04-05 LAB — TROPONIN I (HIGH SENSITIVITY): Troponin I (High Sensitivity): 5 ng/L (ref <18)

## 2022-04-05 MED ORDER — ALBUTEROL SULFATE HFA 108 (90 BASE) MCG/ACT IN AERS
INHALATION_SPRAY | RESPIRATORY_TRACT | Status: AC
  Filled 2022-04-05: qty 6.7

## 2022-04-05 MED ORDER — FERROUS SULFATE 325 (65 FE) MG PO TABS: 325.0000 mg | ORAL_TABLET | Freq: Every day | ORAL | 0 refills | Status: DC

## 2022-04-05 MED ORDER — ALBUTEROL SULFATE HFA 108 (90 BASE) MCG/ACT IN AERS: 2.0000 | INHALATION_SPRAY | Freq: Four times a day (QID) | RESPIRATORY_TRACT | 0 refills | Status: DC | PRN

## 2022-04-05 MED ORDER — ALBUTEROL SULFATE HFA 108 (90 BASE) MCG/ACT IN AERS
2.0000 | INHALATION_SPRAY | Freq: Once | RESPIRATORY_TRACT | Status: AC
  Administered 2022-04-05: 2 via RESPIRATORY_TRACT

## 2022-04-05 MED ORDER — SENNA 8.6 MG PO TABS: 1.0000 | ORAL_TABLET | Freq: Every day | ORAL | 0 refills | Status: DC

## 2022-04-05 NOTE — ED Provider Triage Note (Signed)
Emergency Medicine Provider Triage Evaluation Note  April Knight , a 47 y.o. female  was evaluated in triage.  Patient complains of severe fatigue and weakness.  Says has been going on for over a month but she went to urgent care today because she felt it was getting worse.  She was told to come here because her hemoglobin was 8.  Never needed a blood transfusion and she is not sure she is interested in that today.  Denies any thinners, melena, NSAID or alcohol use.  Review of Systems  Positive:  Negative:   Physical Exam  BP (!) 149/89 (BP Location: Right Arm)   Pulse 86   Temp 98.6 F (37 C)   Resp (!) 24   Ht 5' 6.5" (1.689 m)   Wt 99.8 kg   LMP 03/29/2022   SpO2 99%   BMI 34.98 kg/m  Gen:   Awake, no distress   Resp:  Normal effort  MSK:   Moves extremities without difficulty  Other:  Alert and providing her own history.  Medical Decision Making  Medically screening exam initiated at 6:39 PM.  Appropriate orders placed.  April Knight was informed that the remainder of the evaluation will be completed by another provider, this initial triage assessment does not replace that evaluation, and the importance of remaining in the ED until their evaluation is complete.    Hemoglobin 8.  Will recheck and type and screen.  Patient is reluctant to have a blood transfusion, may not be indicated with a hemoglobin of 8 today.   Rhae Hammock, [place] 04/05/22 1840

## 2022-04-05 NOTE — ED Notes (Signed)
Pt to xray

## 2022-04-05 NOTE — ED Notes (Signed)
Pt refusing IV at this time  Will wait for provider to come speak with her regarding possible blood transfusion and IV

## 2022-04-05 NOTE — ED Provider Notes (Addendum)
[place]    CSN: [id number] Arrival date & time: 04/05/22  1534      History   Chief Complaint Chief Complaint  Patient presents with   Shortness of Breath    HPI April Knight is a 47 y.o. female.  Here with 1 month history of shortness of breath, worsening today. Also fatigue. Denies congestion or cough. No lung history. No fevers.  April Knight did see PCP at onset and took course of doxy. Helped at first then symptoms returned.  History of anemia. Denies any hematuria, hematochezia, dark stool, abdominal pain. No recent trauma or injury.  Additionally would like blood work STD testing today. No known exposures.   History reviewed. No pertinent past medical history.  Patient Active Problem List   Diagnosis Date Noted   S/P C-section 05/22/2013   Ventricular septal defect, fetal, affecting care of mother, antepartum 03/25/2013   Supervision of high risk pregnancy in third trimester 03/24/2013   Advanced maternal age (AMA) in pregnancy 03/17/2013   Anemia 03/17/2013   Late prenatal care 0000000   Drug use complicating pregnancy 0000000   Previous cesarean delivery, antepartum condition or complication [id number]    Past Surgical History:  Procedure Laterality Date   CESAREAN SECTION  1995,2004,2007,2009    x 4   CESAREAN SECTION N/A 05/22/2013   Procedure: CESAREAN SECTION;  Surgeon: Mora Bellman, MD;  Location: [facility];  Service: Obstetrics;  Laterality: N/A;    OB History     Gravida  6   Para  5   Term  4   Preterm  1   AB  1   Living  5      SAB      IAB  1   Ectopic      Multiple      Live Births  5            Home Medications    Prior to Admission medications   Medication Sig Start Date End Date Taking? Authorizing Provider  albuterol (VENTOLIN HFA) 108 (90 Base) MCG/ACT inhaler Inhale 2 puffs into the lungs every 6 (six) hours as needed for wheezing or shortness of breath. 04/05/22  Yes Michae Grimley, Wells Guiles,  PA-C  cetirizine (ZYRTEC ALLERGY) 10 MG tablet Take 1 tablet (10 mg total) by mouth daily. 04/17/20   Volney American, PA-C  Flaxseed, Linseed, (FLAX SEEDS) POWD Take 1 Scoop by mouth daily. Flax Seed Powder    [provider]  fluticasone (FLONASE) 50 MCG/ACT nasal spray Place 1 spray into both nostrils in the morning and at bedtime. 04/17/20   Volney American, PA-C  permethrin (ELIMITE) 5 % cream Apply to whole body once, leave on for 8 hours, rinse off in shower 03/28/20   Volney American, PA-C  TURMERIC PO Take 1 capsule by mouth daily. Turmeric Capsule    [provider]    Family History History reviewed. No pertinent family history.  Social History Social History   Tobacco Use   Smoking status: Every Day    Packs/day: 0.25    Years: 10.00    Additional pack years: 0.00    Total pack years: 2.50    Types: Cigarettes   Smokeless tobacco: Never  Vaping Use   Vaping Use: Never used  Substance Use Topics   Alcohol use: Yes   Drug use: Yes    Types: Cocaine, Marijuana     Allergies   Patient has no known allergies.   Review  of Systems Review of Systems As per HPI  Physical Exam Triage Vital Signs ED Triage Vitals  Enc Vitals Group     BP 04/05/22 1615 116/79     Pulse Rate 04/05/22 1615 87     Resp 04/05/22 1615 (!) 24     Temp 04/05/22 1615 98 F (36.7 C)     Temp src --      SpO2 04/05/22 1615 100 %     Weight --      Height --      Head Circumference --      Peak Flow --      Pain Score 04/05/22 1624 0     Pain Loc --      Pain Edu? --      Excl. in [place]? --    No data found.  Updated Vital Signs BP 116/79   Pulse 87   Temp 98 F (36.7 C)   Resp (!) 24   LMP 03/29/2022   SpO2 100%   Physical Exam Vitals and nursing note reviewed.  Constitutional:      General: April Knight is not in acute distress.    Appearance: April Knight is not ill-appearing.  HENT:     Mouth/Throat:     Mouth: Mucous membranes are moist.      Pharynx: Oropharynx is clear.  Eyes:     Pupils: Pupils are equal, round, and reactive to light.     Comments: Bulbar conjunctiva pale pink  Cardiovascular:     Rate and Rhythm: Normal rate and regular rhythm.     Heart sounds: Normal heart sounds.  Pulmonary:     Effort: Pulmonary effort is normal. No respiratory distress.     Breath sounds: Normal breath sounds. No wheezing or rales.  Abdominal:     Tenderness: There is no abdominal tenderness. There is no guarding.  Skin:    General: Skin is warm and dry.  Neurological:     General: No focal deficit present.     Mental Status: April Knight is alert and oriented to person, place, and time.     Motor: No weakness.     UC Treatments / Results  Labs (all labs ordered are listed, but only abnormal results are displayed) Labs Reviewed  CBC - Abnormal; Notable for the following components:      Result Value   RBC 3.53 (*)    Hemoglobin 8.0 (*)    HCT 26.6 (*)    MCV 75.4 (*)    MCH 22.7 (*)    RDW 16.5 (*)    Platelets 409 (*)    All other components within normal limits  CBG MONITORING, ED - Abnormal; Notable for the following components:   Glucose-Capillary 116 (*)    All other components within normal limits  RPR  HIV ANTIBODY (ROUTINE TESTING W REFLEX)    EKG  Radiology DG Chest 2 View  Result Date: 04/05/2022 CLINICAL DATA:  Shortness of breath EXAM: CHEST - 2 VIEW COMPARISON:  None Available. FINDINGS: The heart size and mediastinal contours are within normal limits. Both lungs are clear. The visualized skeletal structures are unremarkable. IMPRESSION: No active cardiopulmonary disease. Electronically Signed   By: Ronney Asters M.D.   On: 04/05/2022 16:45    Procedures Procedures  Medications Ordered in UC Medications  albuterol (VENTOLIN HFA) 108 (90 Base) MCG/ACT inhaler 2 puff (2 puffs Inhalation Given 04/05/22 1734)    Initial Impression / Assessment and Plan / UC Course  I have  reviewed the triage vital signs  and the nursing notes.  Pertinent labs & imaging results that were available during my care of the patient were reviewed by me and considered in my medical decision making (see chart for details).  Tachypneic in triage but resolved without intervention when seen by this provider. CXR independently reviewed by me, negative. Agree with radiology interpretation.  CBG 116 STAT CBC pending. Advised patient to wait for result but April Knight would like to go home. Reasonable given stable vitals and overall well appearance. Sating 97-100% on RA.  Sent inhaler to use q6 hours at home. 2 puffs given in clinic with instructions how to use.   Strict ED precautions discussed  HIV/RPR pending per patient request. April Knight would like to come back tomorrow for additional STD testing.   ADDENDUM 5:54 PM CBC resulted with hemoglobin of 8. Spoke with patient and advised her to be seen in the ED for further eval and testing. Patient is agreeable. Fiance will transport her there.  Final Clinical Impressions(s) / UC Diagnoses   Final diagnoses:  Shortness of breath  Fatigue, unspecified type  Screen for STD (sexually transmitted disease)     Discharge Instructions      I will call you if your hemoglobin results abnormal. If very low, you will need to be evaluated in the emergency department right away.  You can use the inhaler every 6 hours for the next several days which may help with shortness of breath.  Increase fluid intake Please follow with your primary care provider regarding your continued fatigue.   Please go to the emergency department if symptoms worsen.     ED Prescriptions     Medication Sig Dispense Auth. Provider   albuterol (VENTOLIN HFA) 108 (90 Base) MCG/ACT inhaler Inhale 2 puffs into the lungs every 6 (six) hours as needed for wheezing or shortness of breath. 8 g Terrence Pizana, Wells Guiles, PA-C      PDMP not reviewed this encounter.      Fabian Walder, Wells Guiles, PA-C 04/05/22 1800

## 2022-04-05 NOTE — ED Notes (Addendum)
Pt to treatment room at this time, alert, NAD noted, pt appears to be under the influence

## 2022-04-05 NOTE — ED Triage Notes (Signed)
Pt came in via POV d/t UC sending her here since her Hgb is 8 & she is fatigued & SOB, & has had these s/s for 1 month. Denies pain while in triage, A/Ox4.

## 2022-04-05 NOTE — Discharge Instructions (Addendum)
Your hemoglobin today was 7.7.  It has been low in the past.  You state you have history of iron deficiency anemia.  He does not currently take any iron supplement.  I do recommend that you buy an iron supplement over-the-counter and start taking it.  Also gave you a referral to hematology.  Please schedule a follow-up appointment with them.  We discussed a blood transfusion today however you declined this and state you would rather follow-up with hematology and start iron supplement in the meantime.  Please return for any concerning or worsening symptoms.  I did send in an iron supplement to the pharmacy for you.  Since you state this also constipates you have sent in a stool softener.

## 2022-04-05 NOTE — ED Triage Notes (Signed)
PT reports [facility] for one month. Pt reports she is so tired and can not get out of the bed. Pt went to PCP one month ago for [facility] and was told she had Bronchitis. Pt has forced rapid  respirations PLAN: SPO2 on RA 100% in triage.Pt anxious.

## 2022-04-05 NOTE — Discharge Instructions (Addendum)
I will call you if your hemoglobin results abnormal. If very low, you will need to be evaluated in the emergency department right away.  You can use the inhaler every 6 hours for the next several days which may help with shortness of breath.  Increase fluid intake Please follow with your primary care provider regarding your continued fatigue.   Please go to the emergency department if symptoms worsen.

## 2022-04-05 NOTE — ED Provider Notes (Signed)
[place] Provider Note   CSN: [id number] Arrival date & time: 04/05/22  1816     History  Chief Complaint  Patient presents with   Low Hgb   SOB    April Knight is a 47 y.o. female.  47 year old female presents with 1 month duration of shortness of without has been progressively worsening.  Also endorses fatigue.  Has history of iron deficiency anemia.  Denies history of heart disease, or COPD, or asthma.  She went through a course of doxycycline which initially helped.  Denies any hematochezia, melanotic stools, hemoptysis, or other source of bleeding.  Denies any heavy menstrual cycles.  The history is provided by the patient. No language interpreter was used.         Home Medications Prior to Admission medications   Medication Sig Start Date End Date Taking? Authorizing Provider  albuterol (VENTOLIN HFA) 108 (90 Base) MCG/ACT inhaler Inhale 2 puffs into the lungs every 6 (six) hours as needed for wheezing or shortness of breath. 04/05/22   Rising, Wells Guiles, PA-C  cetirizine (ZYRTEC ALLERGY) 10 MG tablet Take 1 tablet (10 mg total) by mouth daily. 04/17/20   Volney American, PA-C  Flaxseed, Linseed, (FLAX SEEDS) POWD Take 1 Scoop by mouth daily. Flax Seed Powder    [provider]  fluticasone (FLONASE) 50 MCG/ACT nasal spray Place 1 spray into both nostrils in the morning and at bedtime. 04/17/20   Volney American, PA-C  permethrin (ELIMITE) 5 % cream Apply to whole body once, leave on for 8 hours, rinse off in shower 03/28/20   Volney American, PA-C  TURMERIC PO Take 1 capsule by mouth daily. Turmeric Capsule    [provider]      Allergies    Patient has no known allergies.    Review of Systems   Review of Systems  Constitutional:  Positive for fatigue. Negative for chills and fever.  Eyes:  Negative for photophobia.  Respiratory:  Positive for shortness of breath.    Cardiovascular:  Negative for chest pain.  Gastrointestinal:  Negative for abdominal pain, blood in stool, nausea and vomiting.  Genitourinary:  Negative for dysuria.  Neurological:  Negative for speech difficulty and light-headedness.  All other systems reviewed and are negative.   Physical Exam Updated Vital Signs BP (!) 128/97   Pulse 87   Temp 98 F (36.7 C) (Oral)   Resp 16   Ht 5' 6.5" (1.689 m)   Wt 99.8 kg   LMP 03/29/2022   SpO2 100%   BMI 34.98 kg/m  Physical Exam Vitals and nursing note reviewed.  Constitutional:      General: She is not in acute distress.    Appearance: Normal appearance. She is not ill-appearing.  HENT:     Head: Normocephalic and atraumatic.     Nose: Nose normal.  Eyes:     General: No scleral icterus.    Extraocular Movements: Extraocular movements intact.     Conjunctiva/sclera: Conjunctivae normal.  Cardiovascular:     Rate and Rhythm: Normal rate and regular rhythm.     Pulses: Normal pulses.  Pulmonary:     Effort: Pulmonary effort is normal. No respiratory distress.     Breath sounds: Normal breath sounds. No wheezing or rales.  Abdominal:     General: There is no distension.     Tenderness: There is no abdominal tenderness.  Musculoskeletal:  General: Normal range of motion.     Cervical back: Normal range of motion.  Skin:    General: Skin is warm and dry.  Neurological:     General: No focal deficit present.     Mental Status: She is alert and oriented to person, place, and time. Mental status is at baseline.     ED Results / Procedures / Treatments   Labs (all labs ordered are listed, but only abnormal results are displayed) Labs Reviewed  CBC WITH DIFFERENTIAL/PLATELET - Abnormal; Notable for the following components:      Result Value   RBC 3.44 (*)    Hemoglobin 7.7 (*)    HCT 26.2 (*)    MCV 76.2 (*)    MCH 22.4 (*)    MCHC 29.4 (*)    RDW 16.6 (*)    All other components within normal limits   BASIC METABOLIC PANEL - Abnormal; Notable for the following components:   Glucose, Bld 109 (*)    All other components within normal limits  BRAIN NATRIURETIC PEPTIDE  TYPE AND SCREEN  TROPONIN I (HIGH SENSITIVITY)    EKG None  Radiology DG Chest 2 View  Result Date: 04/05/2022 CLINICAL DATA:  Shortness of breath EXAM: CHEST - 2 VIEW COMPARISON:  None Available. FINDINGS: The heart size and mediastinal contours are within normal limits. Both lungs are clear. The visualized skeletal structures are unremarkable. IMPRESSION: No active cardiopulmonary disease. Electronically Signed   By: Ronney Asters M.D.   On: 04/05/2022 16:45    Procedures Procedures    Medications Ordered in ED Medications - No data to display  ED Course/ Medical Decision Making/ A&P                             Medical Decision Making Amount and/or Complexity of Data Reviewed Labs: ordered. Radiology: ordered.  Risk OTC drugs.   47 year old female presents with low hemoglobin, progressively worsening dyspnea on exertion, fatigue ongoing for about a month.  She was seen at urgent care, had labs drawn and was called to come to the emergency department due to hemoglobin of 8.0.  She has history of iron deficiency anemia.  Denies any signs of bleeding.  Denies any heavy menstrual cycles.  Does not currently take an iron supplement. Workup here reveals CBC without leukocytosis, hemoglobin at 7.7, platelets of 380.  BMP with glucose of 109 otherwise unremarkable.  Troponin of 5, EKG without acute ischemic changes, chest x-ray done earlier without acute cardiopulmonary process.  BNP of 31.  Given her symptomatology, low hemoglobin although without signs of active bleed we did discuss transfusion and hematology referral with starting her on iron supplement.  Following this discussion she deferred this and states she would like the otology referral with our supplement but would like to defer on the transfusion.  Feel  this is reasonable.  She will return for any concerning symptoms.  Case discussed with attending.  Patient discharged in appropriate condition.  Strict return precautions given.   Final Clinical Impression(s) / ED Diagnoses Final diagnoses:  Anemia, unspecified type    Rx / DC Orders ED Discharge Orders          Ordered    senna (SENOKOT) 8.6 MG TABS tablet  Daily        04/05/22 2316    ferrous sulfate 325 (65 FE) MG tablet  Daily        04/05/22 2316  Ambulatory referral to Hematology / Oncology       Comments: Your emergency department provider has referred you to see a hematology/oncology specialist. These are physicians who specialize in blood disorders and cancers, or findings concerning for cancer. You will receive a phone call from the [facility] to set up your appointment within 2 business days: [employer] operate Mon - Fri, 8:00 a.m. to 5:00 p.m.; closed for federally recognized holidays. Please be sure your phone is not set to block numbers during this time. You may call [phone] at any point during [place] hours of operation to make an appointment as well.   04/05/22 2321              Evlyn Courier, PA-C 04/05/22 2357    Elgie Congo, MD 04/06/22 1435

## 2022-04-06 LAB — RPR: RPR Ser Ql: NONREACTIVE

## 2022-04-07 ENCOUNTER — Telehealth: Payer: Medicaid Other | Admitting: Family Medicine

## 2022-04-07 NOTE — Progress Notes (Signed)
Pt requesting follow up from ED. She has missed their call twice and wanted to know if we could discuss it with her. I have told her she will need to f.u with them since they treated. DWB

## 2022-04-09 ENCOUNTER — Telehealth: Payer: Self-pay | Admitting: Hematology and Oncology

## 2022-04-09 NOTE — Telephone Encounter (Signed)
scheduled per 3/18 referral, pt has been called and confirmed date and time. Pt is aware of location and to arrive early for check in

## 2022-04-13 ENCOUNTER — Other Ambulatory Visit: Payer: Self-pay

## 2022-04-13 ENCOUNTER — Inpatient Hospital Stay: Payer: Medicaid Other

## 2022-04-13 ENCOUNTER — Encounter: Payer: Self-pay | Admitting: Hematology and Oncology

## 2022-04-13 ENCOUNTER — Inpatient Hospital Stay: Payer: Medicaid Other | Attending: Hematology and Oncology | Admitting: Hematology and Oncology

## 2022-04-13 VITALS — BP 112/73 | HR 97 | Temp 97.8°F | Resp 18 | Ht 66.0 in | Wt 205.2 lb

## 2022-04-13 DIAGNOSIS — F17210 Nicotine dependence, cigarettes, uncomplicated: Secondary | ICD-10-CM | POA: Diagnosis not present

## 2022-04-13 DIAGNOSIS — D509 Iron deficiency anemia, unspecified: Secondary | ICD-10-CM

## 2022-04-13 DIAGNOSIS — Z79899 Other long term (current) drug therapy: Secondary | ICD-10-CM | POA: Diagnosis not present

## 2022-04-13 DIAGNOSIS — F1290 Cannabis use, unspecified, uncomplicated: Secondary | ICD-10-CM | POA: Insufficient documentation

## 2022-04-13 DIAGNOSIS — R0602 Shortness of breath: Secondary | ICD-10-CM | POA: Diagnosis not present

## 2022-04-13 LAB — CBC WITH DIFFERENTIAL/PLATELET
Abs Immature Granulocytes: 0.02 10*3/uL (ref 0.00–0.07)
Basophils Absolute: 0 10*3/uL (ref 0.0–0.1)
Basophils Relative: 0 %
Eosinophils Absolute: 0.8 10*3/uL — ABNORMAL HIGH (ref 0.0–0.5)
Eosinophils Relative: 10 %
HCT: 33.6 % — ABNORMAL LOW (ref 36.0–46.0)
Hemoglobin: 9.9 g/dL — ABNORMAL LOW (ref 12.0–15.0)
Immature Granulocytes: 0 %
Lymphocytes Relative: 23 %
Lymphs Abs: 1.8 10*3/uL (ref 0.7–4.0)
MCH: 22.4 pg — ABNORMAL LOW (ref 26.0–34.0)
MCHC: 29.5 g/dL — ABNORMAL LOW (ref 30.0–36.0)
MCV: 76.2 fL — ABNORMAL LOW (ref 80.0–100.0)
Monocytes Absolute: 0.7 10*3/uL (ref 0.1–1.0)
Monocytes Relative: 9 %
Neutro Abs: 4.6 10*3/uL (ref 1.7–7.7)
Neutrophils Relative %: 58 %
Platelets: 420 10*3/uL — ABNORMAL HIGH (ref 150–400)
RBC: 4.41 MIL/uL (ref 3.87–5.11)
RDW: 19.2 % — ABNORMAL HIGH (ref 11.5–15.5)
WBC: 8.1 10*3/uL (ref 4.0–10.5)
nRBC: 0 % (ref 0.0–0.2)

## 2022-04-13 LAB — IRON AND IRON BINDING CAPACITY (CC-WL,HP ONLY)
Iron: 180 ug/dL — ABNORMAL HIGH (ref 28–170)
Saturation Ratios: 31 % (ref 10.4–31.8)
TIBC: 587 ug/dL — ABNORMAL HIGH (ref 250–450)
UIBC: 407 ug/dL (ref 148–442)

## 2022-04-13 LAB — FERRITIN: Ferritin: 49 ng/mL (ref 11–307)

## 2022-04-13 NOTE — Progress Notes (Signed)
[facility] CONSULT NOTE  Patient Care Team: System, Provider Not In as PCP - General  CHIEF COMPLAINTS/PURPOSE OF CONSULTATION:  Anemia.  ASSESSMENT & PLAN:   This is a very pleasant 47 year old premenopausal female patient with newly diagnosed iron deficiency anemia referred to hematology for additional recommendations.  She denies any new health complaints from anemia standpoint except for shortness of breath.  She was however thought to have bronchitis and was treated for the same.  She was mostly worried about a possible sexually transmitted disease and she does not think her partner has been lied to her.  Physical examination today without any major findings.  We have reviewed her most recent labs which showed microcytic hypochromic anemia.  She has just darted taking oral iron supplementation for the past 1 week or so.  I have recommended we proceed with CBC, iron panel and ferritin today.  We have discussed about considering intravenous iron and discussed about the possible adverse effects and benefits of intravenous iron.  She at this time wants to try oral iron for longer before wanting to consider IV iron.  This is certainly reasonable as long as she stays compliant with it.  Once again depending on the labs today, we may suggest iron infusion sooner than later.  She will return to clinic in about 8 weeks.  She was encouraged to talk to her primary care provider about possible sexually transmitted diseases.  She expressed understanding of all the recommendations.  Thank you for consulting [place] in the care of this patient.  Please do not hesitate to contact us with any additional questions or concerns. Total time spent: 45 minutes including history, physical exam, review of records, counseling and coordination of care   HISTORY OF PRESENTING ILLNESS:   April Knight 47 y.o. female is here because of anemia.  This is a very pleasant 47 year old female patient with  past medical history significant for anemia, recreational drug abuse, recent visit to ER for shortness of breath referred to hematology for evaluation and recommendations regarding anemia.  She arrived to the appointment today by herself.  She was mostly concerned about possible sexually transmitted disease since she believes her partner has not been loyal to her.  She was wondering if we can do all the test possible to look for sexually transmitted diseases.  She tells me that she has been anemic all her life.  She was recently treated with antibiotics for possible bronchitis, use doxycycline.  She has been taking oral iron supplementation for the past 1 week now. She reports some ongoing menstrual cycles, nothing too heavy.  She had 5 children, last child was 81 years old.  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 She lives by herself.  Rest of the pertinent 10 point ROS reviewed and negative  REVIEW OF SYSTEMS:   Constitutional: Denies fevers, chills or abnormal night sweats Eyes: Denies blurriness of vision, double vision or watery eyes Ears, nose, mouth, throat, and face: Denies mucositis or sore throat Respiratory: Denies cough, dyspnea or wheezes Cardiovascular: Denies palpitation, chest discomfort or lower extremity swelling Gastrointestinal:  Denies nausea, heartburn or change in bowel habits Skin: Denies abnormal skin rashes Lymphatics: Denies new lymphadenopathy or easy bruising Neurological:Denies numbness, tingling or new weaknesses Behavioral/Psych: Mood is stable, no new changes  All other systems were reviewed with the patient and are negative.  MEDICAL HISTORY:  No past medical history on file.  SURGICAL HISTORY:  Past Surgical History:  Procedure Laterality Date   CESAREAN SECTION  1995,2004,2007,2009    x 4   CESAREAN SECTION N/A 05/22/2013   Procedure: CESAREAN SECTION;  Surgeon: Mora Bellman, MD;  Location: [facility];  Service: Obstetrics;   Laterality: N/A;    SOCIAL HISTORY: Social History   Socioeconomic History   Marital status: Single    Spouse name: Not on file   Number of children: Not on file   Years of education: Not on file   Highest education level: Not on file  Occupational History   Not on file  Tobacco Use   Smoking status: Every Day    Packs/day: 0.25    Years: 10.00    Additional pack years: 0.00    Total pack years: 2.50    Types: Cigarettes   Smokeless tobacco: Never  Vaping Use   Vaping Use: Never used  Substance and Sexual Activity   Alcohol use: Yes   Drug use: Yes    Types: Cocaine, Marijuana   Sexual activity: Yes    Birth control/protection: None    Comment: pregnant  Other Topics Concern   Not on file  Social History Narrative   Not on file   Social Determinants of Health   Financial Resource Strain: Not on file  Food Insecurity: Not on file  Transportation Needs: Not on file  Physical Activity: Not on file  Stress: Not on file  Social Connections: Not on file  Intimate Partner Violence: Not on file    FAMILY HISTORY: No family history on file.  ALLERGIES:  has No Known Allergies.  MEDICATIONS:  Current Outpatient Medications  Medication Sig Dispense Refill   albuterol (VENTOLIN HFA) 108 (90 Base) MCG/ACT inhaler Inhale 2 puffs into the lungs every 6 (six) hours as needed for wheezing or shortness of breath. 8 g 0   cetirizine (ZYRTEC ALLERGY) 10 MG tablet Take 1 tablet (10 mg total) by mouth daily. 30 tablet 2   ferrous sulfate 325 (65 FE) MG tablet Take 1 tablet (325 mg total) by mouth daily. 30 tablet 0   Flaxseed, Linseed, (FLAX SEEDS) POWD Take 1 Scoop by mouth daily. Flax Seed Powder     fluticasone (FLONASE) 50 MCG/ACT nasal spray Place 1 spray into both nostrils in the morning and at bedtime. 16 g 2   permethrin (ELIMITE) 5 % cream Apply to whole body once, leave on for 8 hours, rinse off in shower 60 g 0   senna (SENOKOT) 8.6 MG TABS tablet Take 1 tablet (8.6  mg total) by mouth daily. 120 tablet 0   TURMERIC PO Take 1 capsule by mouth daily. Turmeric Capsule     No current facility-administered medications for this visit.     PHYSICAL EXAMINATION: ECOG PERFORMANCE STATUS: 0 - Asymptomatic  Vitals:   04/13/22 1045  BP: 112/73  Pulse: 97  Resp: 18  Temp: 97.8 F (36.6 C)  SpO2: 100%   Filed Weights   04/13/22 1045  Weight: 205 lb 3.2 oz (93.1 kg)    GENERAL:alert, no distress and comfortable SKIN: skin color, texture, turgor are normal, no rashes or significant lesions EYES: normal, conjunctiva are pink and non-injected, sclera clear OROPHARYNX:no exudate, no erythema and lips, buccal mucosa, and tongue normal  NECK: supple, thyroid normal size, non-tender, without nodularity LYMPH:  no palpable lymphadenopathy in the cervical, axillary LUNGS: clear to auscultation and percussion with normal breathing effort HEART: regular rate & rhythm and no murmurs and no lower extremity edema  ABDOMEN:abdomen soft, non-tender and normal bowel sounds Musculoskeletal:no cyanosis of digits and no clubbing  PSYCH: alert & oriented x 3 with fluent speech NEURO: no focal motor/sensory deficits  LABORATORY DATA:  I have reviewed the data as listed Lab Results  Component Value Date   WBC 7.6 04/05/2022   HGB 7.7 (L) 04/05/2022   HCT 26.2 (L) 04/05/2022   MCV 76.2 (L) 04/05/2022   PLT 380 04/05/2022     Chemistry      Component Value Date/Time   NA 137 04/05/2022 1854   K 4.1 04/05/2022 1854   CL 104 04/05/2022 1854   CO2 26 04/05/2022 1854   BUN 10 04/05/2022 1854   CREATININE 0.72 04/05/2022 1854      Component Value Date/Time   CALCIUM 9.1 04/05/2022 1854   ALKPHOS 47 01/14/2018 1953   AST 44 (H) 01/14/2018 1953   ALT 30 01/14/2018 1953   BILITOT 1.2 01/14/2018 1953       RADIOGRAPHIC STUDIES: I have personally reviewed the radiological images as listed and agreed with the findings in the report. DG Chest 2 View  Result  Date: 04/05/2022 CLINICAL DATA:  Shortness of breath EXAM: CHEST - 2 VIEW COMPARISON:  None Available. FINDINGS: The heart size and mediastinal contours are within normal limits. Both lungs are clear. The visualized skeletal structures are unremarkable. IMPRESSION: No active cardiopulmonary disease. Electronically Signed   By: Ronney Asters M.D.   On: 04/05/2022 16:45    All questions were answered. The patient knows to call the clinic with any problems, questions or concerns. I spent 45 minutes in the care of this patient including H and P, review of records, counseling and coordination of care.     Benay Pike, MD 04/13/2022 11:01 AM

## 2022-04-29 ENCOUNTER — Telehealth: Payer: Medicaid Other

## 2022-06-07 ENCOUNTER — Telehealth: Payer: Medicaid Other | Admitting: Physician Assistant

## 2022-06-07 NOTE — Progress Notes (Signed)
The patient no-showed for appointment despite this provider sending direct link, reaching out via phone with no response and waiting for at least 10 minutes from appointment time for patient to join. They will be marked as a NS for this appointment/time.   Mattia Liford M Khrystina Bonnes, PA-C    

## 2022-06-08 ENCOUNTER — Inpatient Hospital Stay: Payer: Medicaid Other | Attending: Hematology and Oncology | Admitting: Adult Health

## 2022-06-18 ENCOUNTER — Telehealth: Payer: Medicaid Other | Admitting: Nurse Practitioner

## 2022-06-18 DIAGNOSIS — L709 Acne, unspecified: Secondary | ICD-10-CM

## 2022-06-18 MED ORDER — MINOCYCLINE HCL 50 MG PO CAPS: 50.0000 mg | ORAL_CAPSULE | Freq: Two times a day (BID) | ORAL | 0 refills | Status: AC

## 2022-06-18 NOTE — Progress Notes (Signed)
E-Visit for Acne  We are sorry that you are experiencing this issue.  Here is how we plan to help!  Based on what you shared with me it looks like you have cystic acne.  Acne is a disorder of the hair follicles and oil glands (sebaceous glands). The sebaceous glands secrete oils to keep the skin moist.  When the glands get clogged, it can lead to pimples or cysts.  These cysts may become infected and leave scars. Acne is very common and normally occurs at puberty.  Acne is also inherited.  Your personal care plan consists of the following recommendations:  I recommend that you use a daily cleanser  You might try an over the counter cleanser that has benzoyl peroxide.  I recommend that you start with a product that has 2.5% benzoyl peroxide.  Stronger concentrations have not been shown to be more effective.  I have also prescribed one of the following additional therapies:  Minocycline an oral antibiotic 50 mg twice a day  If excessive dryness or peeling occurs, reduce dose frequency or concentration of the topical scrubs.  If excessive stinging or burning occurs, remove the topical gel with mild soap and water and resume at a lower dose the next day.  Remember oral antibiotics and topical acne treatments may increase your sensitivity to the sun!  HOME CARE: Do not squeeze pimples because that can often lead to infections, worse acne, and scars. Use a moisturizer that contains retinoid or fruit acids that may inhibit the development of new acne lesions. Although there is not a clear link that foods can cause acne, doctors do believe that too many sweets predispose you to skin problems.  GET HELP RIGHT AWAY IF: If your acne gets worse or is not better within 10 days. If you become depressed. If you become pregnant, discontinue medications and call your OB/GYN.  MAKE SURE YOU: Understand these instructions. Will watch your condition. Will get help right away if you are not doing well or  get worse.  Thank you for choosing an e-visit.  Your e-visit answers were reviewed by a board certified advanced clinical practitioner to complete your personal care plan. Depending upon the condition, your plan could have included both over the counter or prescription medications.  Please review your pharmacy choice. Make sure the pharmacy is open so you can pick up prescription now. If there is a problem, you may contact your provider through [employer] and have the prescription routed to another pharmacy.  Your safety is important to [place]. If you have drug allergies check your prescription carefully.   For the next 24 hours you can use MyChart to ask questions about today's visit, request a non-urgent call back, or ask for a work or school excuse. You will get an email in the next two days asking about your experience. I hope that your e-visit has been valuable and will speed your recovery.   Meds ordered this encounter  Medications   minocycline (MINOCIN) 50 MG capsule    Sig: Take 1 capsule (50 mg total) by mouth 2 (two) times daily.    Dispense:  60 capsule    Refill:  0    I spent approximately 5 minutes reviewing the patient's history, current symptoms and coordinating their care today.

## 2022-07-01 ENCOUNTER — Telehealth: Payer: Medicaid Other | Admitting: Family

## 2022-07-01 DIAGNOSIS — L309 Dermatitis, unspecified: Secondary | ICD-10-CM

## 2022-07-01 MED ORDER — TRIAMCINOLONE ACETONIDE 0.5 % EX OINT: 1.0000 | TOPICAL_OINTMENT | Freq: Two times a day (BID) | CUTANEOUS | 0 refills | Status: DC

## 2022-07-01 NOTE — Progress Notes (Signed)
E Visit for Rash  We are sorry that you are not feeling well. Here is how we plan to help!  Based on what you shared with me it looks like you have contact dermatitis.  Contact dermatitis is a skin rash caused by something that touches the skin and causes irritation or inflammation.  Your skin may be red, swollen, dry, cracked, and itch.  The rash should go away in a few days but can last a few weeks.  If you get a rash, it's important to figure out what caused it so the irritant can be avoided in the future.    I have sent kenalog cream you will apply twice a day.    HOME CARE:  Take cool showers and avoid direct sunlight. Apply cool compress or wet dressings. Take a bath in an oatmeal bath.  Sprinkle content of one Aveeno packet under running faucet with comfortably warm water.  Bathe for 15-20 minutes, 1-2 times daily.  Pat dry with a towel. Do not rub the rash. Use hydrocortisone cream. Take an antihistamine like Benadryl for widespread rashes that itch.  The adult dose of Benadryl is 25-50 mg by mouth 4 times daily. Caution:  This type of medication may cause sleepiness.  Do not drink alcohol, drive, or operate dangerous machinery while taking antihistamines.  Do not take these medications if you have prostate enlargement.  Read package instructions thoroughly on all medications that you take.  GET HELP RIGHT AWAY IF:  Symptoms don't go away after treatment. Severe itching that persists. If you rash spreads or swells. If you rash begins to smell. If it blisters and opens or develops a yellow-brown crust. You develop a fever. You have a sore throat. You become short of breath.  MAKE SURE YOU:  Understand these instructions. Will watch your condition. Will get help right away if you are not doing well or get worse.  Thank you for choosing an e-visit.  Your e-visit answers were reviewed by a board certified advanced clinical practitioner to complete your personal care plan.  Depending upon the condition, your plan could have included both over the counter or prescription medications.  Please review your pharmacy choice. Make sure the pharmacy is open so you can pick up prescription now. If there is a problem, you may contact your provider through [employer] and have the prescription routed to another pharmacy.  Your safety is important to [place]. If you have drug allergies check your prescription carefully.   For the next 24 hours you can use MyChart to ask questions about today's visit, request a non-urgent call back, or ask for a work or school excuse. You will get an email in the next two days asking about your experience. I hope that your e-visit has been valuable and will speed your recovery.  Approximately 5 minutes was spent documenting and reviewing patient's chart.

## 2022-07-12 ENCOUNTER — Telehealth: Payer: Medicaid Other | Admitting: Physician Assistant

## 2022-07-12 NOTE — Progress Notes (Signed)
The patient no-showed for appointment despite this provider sending direct link, reaching out via phone with no response and waiting for at least 10 minutes from appointment time for patient to join. They will be marked as a NS for this appointment/time.   Konstantina Nachreiner M Sachi Boulay, PA-C    

## 2022-08-09 ENCOUNTER — Ambulatory Visit: Payer: Medicaid Other | Admitting: Sports Medicine

## 2022-08-16 ENCOUNTER — Ambulatory Visit (HOSPITAL_COMMUNITY)
Admission: EM | Admit: 2022-08-16 | Discharge: 2022-08-16 | Disposition: A | Discharge status: Home / Self Care | Payer: Medicaid Other | Attending: Internal Medicine | Admitting: Internal Medicine

## 2022-08-16 ENCOUNTER — Ambulatory Visit (INDEPENDENT_AMBULATORY_CARE_PROVIDER_SITE_OTHER): Payer: Medicaid Other

## 2022-08-16 ENCOUNTER — Encounter (HOSPITAL_COMMUNITY): Payer: Self-pay | Admitting: Emergency Medicine

## 2022-08-16 ENCOUNTER — Other Ambulatory Visit: Payer: Self-pay

## 2022-08-16 DIAGNOSIS — M545 Low back pain, unspecified: Secondary | ICD-10-CM

## 2022-08-16 DIAGNOSIS — W19XXXA Unspecified fall, initial encounter: Secondary | ICD-10-CM

## 2022-08-16 DIAGNOSIS — Z3202 Encounter for pregnancy test, result negative: Secondary | ICD-10-CM

## 2022-08-16 DIAGNOSIS — S39012A Strain of muscle, fascia and tendon of lower back, initial encounter: Secondary | ICD-10-CM | POA: Diagnosis not present

## 2022-08-16 LAB — POCT URINE PREGNANCY: Preg Test, Ur: NEGATIVE

## 2022-08-16 MED ORDER — DEXAMETHASONE SODIUM PHOSPHATE 10 MG/ML IJ SOLN
INTRAMUSCULAR | Status: AC
  Filled 2022-08-16: qty 1

## 2022-08-16 MED ORDER — METHOCARBAMOL 500 MG PO TABS: 500.0000 mg | ORAL_TABLET | Freq: Two times a day (BID) | ORAL | 0 refills | Status: DC

## 2022-08-16 MED ORDER — DEXAMETHASONE SODIUM PHOSPHATE 10 MG/ML IJ SOLN
10.0000 mg | Freq: Once | INTRAMUSCULAR | Status: AC
  Administered 2022-08-16: 10 mg via INTRAMUSCULAR

## 2022-08-16 NOTE — ED Notes (Signed)
Refused wheelchair upon exiting the building.  Repeatedly was asked if wheelchair needed

## 2022-08-16 NOTE — ED Provider Notes (Addendum)
[facility]    CSN: [id number] Arrival date & time: 08/16/22  1645      History   Chief Complaint Chief Complaint  Patient presents with   Fall    HPI April Knight is a 47 y.o. female.   Patient presents to urgent care for evaluation of low back pain after she suffered a mechanical fall at home yesterday. She was walking on hard wood floor and tripped on a pile of water on the floor that she did not see. When she fell, she landed onto her low back and her legs went forward. She does not believe she hit her head. She did not pass out, no preceding chest pain/shortness of breath. Now experiencing pain to the mid lumbar spine that is worsened by movement. No numbness/tingling to the bilateral lower extremities or extremity weakness. No loss of bladder/bowel continence or saddle anesthesia symptoms.  History of lumbar spinal stenosis.  She has not taken any over-the-counter medications to help with pain before coming to urgent care.    Fall    History reviewed. No pertinent past medical history.  Patient Active Problem List   Diagnosis Date Noted   S/P C-section 05/22/2013   Ventricular septal defect, fetal, affecting care of mother, antepartum 03/25/2013   Supervision of high risk pregnancy in third trimester 03/24/2013   Advanced maternal age (AMA) in pregnancy 03/17/2013   Anemia 03/17/2013   Late prenatal care 03/17/2013   Drug use complicating pregnancy 03/17/2013   Previous cesarean delivery, antepartum condition or complication 01/02/2013    Past Surgical History:  Procedure Laterality Date   CESAREAN SECTION  1995,2004,2007,2009    x 4   CESAREAN SECTION N/A 05/22/2013   Procedure: CESAREAN SECTION;  Surgeon: Catalina Antigua, MD;  Location: [facility];  Service: Obstetrics;  Laterality: N/A;    OB History     Gravida  6   Para  5   Term  4   Preterm  1   AB  1   Living  5      SAB      IAB  1   Ectopic      Multiple      Live  Births  5            Home Medications    Prior to Admission medications   Medication Sig Start Date End Date Taking? Authorizing Provider  methocarbamol (ROBAXIN) 500 MG tablet Take 1 tablet (500 mg total) by mouth 2 (two) times daily. 08/16/22  Yes Carlisle Beers, FNP  albuterol (VENTOLIN HFA) 108 (90 Base) MCG/ACT inhaler Inhale 2 puffs into the lungs every 6 (six) hours as needed for wheezing or shortness of breath. Patient not taking: Reported on 08/16/2022 04/05/22   Rising, Lurena Joiner, PA-C  cetirizine (ZYRTEC ALLERGY) 10 MG tablet Take 1 tablet (10 mg total) by mouth daily. Patient not taking: Reported on 08/16/2022 04/17/20   Particia Nearing, PA-C  ferrous sulfate 325 (65 FE) MG tablet Take 1 tablet (325 mg total) by mouth daily. Patient not taking: Reported on 08/16/2022 04/05/22   Marita Kansas, PA-C  Flaxseed, Linseed, (FLAX SEEDS) POWD Take 1 Scoop by mouth daily. Flax Seed Powder Patient not taking: Reported on 08/16/2022    [provider]  fluticasone (FLONASE) 50 MCG/ACT nasal spray Place 1 spray into both nostrils in the morning and at bedtime. Patient not taking: Reported on 08/16/2022 04/17/20   Particia Nearing, PA-C  permethrin (ELIMITE) 5 %  cream Apply to whole body once, leave on for 8 hours, rinse off in shower Patient not taking: Reported on 08/16/2022 03/28/20   Particia Nearing, PA-C  senna (SENOKOT) 8.6 MG TABS tablet Take 1 tablet (8.6 mg total) by mouth daily. Patient not taking: Reported on 08/16/2022 04/05/22   Marita Kansas, PA-C  triamcinolone ointment (KENALOG) 0.5 % Apply 1 Application topically 2 (two) times daily. Patient not taking: Reported on 08/16/2022 07/01/22   Jannifer Rodney A, FNP  TURMERIC PO Take 1 capsule by mouth daily. Turmeric Capsule Patient not taking: Reported on 08/16/2022    [provider]    Family History History reviewed. No pertinent family history.  Social History Social History   Tobacco Use    Smoking status: Every Day    Current packs/day: 0.25    Average packs/day: 0.3 packs/day for 10.0 years (2.5 ttl pk-yrs)    Types: Cigarettes   Smokeless tobacco: Never  Vaping Use   Vaping status: Never Used  Substance Use Topics   Alcohol use: Yes   Drug use: Yes    Types: Cocaine, Marijuana     Allergies   Patient has no known allergies.   Review of Systems Review of Systems Per HPI  Physical Exam Triage Vital Signs ED Triage Vitals  Encounter Vitals Group     BP 08/16/22 1727 (!) 140/86     Systolic BP Percentile --      Diastolic BP Percentile --      Pulse Rate 08/16/22 1727 71     Resp 08/16/22 1727 (!) 24     Temp 08/16/22 1727 98.3 F (36.8 C)     Temp Source 08/16/22 1727 Oral     SpO2 08/16/22 1727 97 %     Weight --      Height --      Head Circumference --      Peak Flow --      Pain Score 08/16/22 1723 10     Pain Loc --      Pain Education --      Exclude from Growth Chart --    No data found.  Updated Vital Signs BP (!) 140/86 (BP Location: Right Arm)   Pulse 71   Temp 98.3 F (36.8 C) (Oral)   Resp (!) 24   LMP 08/10/2022 (Approximate)   SpO2 97%   Visual Acuity Right Eye Distance:   Left Eye Distance:   Bilateral Distance:    Right Eye Near:   Left Eye Near:    Bilateral Near:     Physical Exam Vitals and nursing note reviewed.  Constitutional:      Appearance: She is not ill-appearing or toxic-appearing.  HENT:     Head: Normocephalic and atraumatic.     Right Ear: Hearing and external ear normal.     Left Ear: Hearing and external ear normal.     Nose: Nose normal.     Mouth/Throat:     Lips: Pink.     Mouth: Mucous membranes are moist. No injury.     Tongue: No lesions. Tongue does not deviate from midline.     Palate: No mass and lesions.     Pharynx: Oropharynx is clear. Uvula midline. No pharyngeal swelling, oropharyngeal exudate, posterior oropharyngeal erythema or uvula swelling.     Tonsils: No tonsillar exudate  or tonsillar abscesses.  Eyes:     General: Lids are normal. Vision grossly intact. Gaze aligned appropriately.  Extraocular Movements: Extraocular movements intact.     Conjunctiva/sclera: Conjunctivae normal.  Cardiovascular:     Rate and Rhythm: Normal rate and regular rhythm.     Heart sounds: Normal heart sounds, S1 normal and S2 normal.  Pulmonary:     Effort: Pulmonary effort is normal. No respiratory distress.     Breath sounds: Normal breath sounds and air entry.  Musculoskeletal:     Cervical back: Normal and neck supple.     Thoracic back: Normal.     Lumbar back: Tenderness present. No swelling, edema, deformity, signs of trauma, lacerations, spasms or bony tenderness. Normal range of motion. No scoliosis.       Back:     Comments: TTP to the area indicated above to the upper middle lumbar spine and the bilateral lumbar paraspinals.  More significant tenderness to palpation to the right lumbar paraspinals.  No flank pain.  No overlying rash.  No ecchymosis or signs of trauma/injury.  Skin:    General: Skin is warm and dry.     Capillary Refill: Capillary refill takes less than 2 seconds.     Findings: No rash.  Neurological:     General: No focal deficit present.     Mental Status: She is alert and oriented to person, place, and time. Mental status is at baseline.     Cranial Nerves: Cranial nerves 2-12 are intact. No dysarthria or facial asymmetry.     Sensory: Sensation is intact.     Motor: Motor function is intact. No weakness, tremor or pronator drift.     Coordination: Coordination is intact. Coordination normal.     Gait: Gait is intact.     Comments: Strength and sensation intact to bilateral upper and lower extremities (5/5). Moves all 4 extremities with normal coordination voluntarily. Non-focal neuro exam.   Psychiatric:        Mood and Affect: Mood normal.        Speech: Speech normal.        Behavior: Behavior normal.        Thought Content: Thought  content normal.        Judgment: Judgment normal.      UC Treatments / Results  Labs (all labs ordered are listed, but only abnormal results are displayed) Labs Reviewed  POCT URINE PREGNANCY    EKG   Radiology DG Lumbar Spine Complete  Result Date: 08/16/2022 CLINICAL DATA:  Fall yesterday. Personal history of spinal stenosis. Lumbar pain. EXAM: LUMBAR SPINE - COMPLETE 4 VIEW COMPARISON:  None Available. FINDINGS: There is no evidence of lumbar spine fracture. Alignment is normal. Intervertebral disc spaces are maintained. IMPRESSION: Negative lumbar spine radiographs. Electronically Signed   By: Marin Roberts M.D.   On: 08/16/2022 18:29    Procedures Procedures (including critical care time)  Medications Ordered in UC Medications  dexamethasone (DECADRON) injection 10 mg (10 mg Intramuscular Given 08/16/22 1842)    Initial Impression / Assessment and Plan / UC Course  I have reviewed the triage vital signs and the nursing notes.  Pertinent labs & imaging results that were available during my care of the patient were reviewed by me and considered in my medical decision making (see chart for details).   1. Fall, strain of lumbar region Evaluation suggests pain is muscular in nature.  X-ray of the lumbar spine is negative for acute bony abnormality.  Will manage this with rest, gentle ROM exercises, heat therapy, ibuprofen/Tylenol as needed for pain, and as needed  use of muscle relaxer. Drowsiness precautions discussed regarding muscle relaxer use. Dexamethasone 10 mg IM given in clinic as requested by patient. May follow-up with orthopedics as needed. Pregnancy test performed prior to L-spine x-ray is negative.  Counseled patient on potential for adverse effects with medications prescribed/recommended today, strict ER and return-to-clinic precautions discussed, patient verbalized understanding.    Final Clinical Impressions(s) / UC Diagnoses   Final diagnoses:   Fall, initial encounter  Strain of lumbar region, initial encounter  Negative pregnancy test     Discharge Instructions      Your pain is likely due to a muscle strain which will improve on its own with time.   - You may take over the counter medicines to help with pain.  - If you were given a shot of pain medicine in the clinic today, you may start taking ibuprofen/other NSAIDs in 12-24 hours. - You may also take the prescribed muscle relaxer as directed as needed for muscle aches/spasm.  Do not take this medication and drive or drink alcohol as it can make you sleepy.  Mainly use this medicine at nighttime as needed. - Apply heat 20 minutes on then 20 minutes off and perform gentle range of motion exercises to the area of greatest pain to prevent muscle stiffness and provide further pain relief.   Red flag symptoms to watch out for are numbness/tingling to the legs, weakness, loss of bowel/bladder control, and/or worsening pain that does not respond well to medicines. Follow-up with your primary care provider or return to urgent care if your symptoms do not improve in the next 3 to 4 days with medications and interventions recommended today. If your symptoms are severe (red flag), please go to the emergency room.       ED Prescriptions     Medication Sig Dispense Auth. Provider   methocarbamol (ROBAXIN) 500 MG tablet Take 1 tablet (500 mg total) by mouth 2 (two) times daily. 20 tablet Carlisle Beers, FNP      PDMP not reviewed this encounter.   Carlisle Beers, FNP 08/16/22 1850    Carlisle Beers, FNP 08/16/22 [phone]

## 2022-08-16 NOTE — Discharge Instructions (Signed)
Your pain is likely due to a muscle strain which will improve on its own with time.   - You may take over the counter medicines to help with pain.  - If you were given a shot of pain medicine in the clinic today, you may start taking ibuprofen/other NSAIDs in 12-24 hours. - You may also take the prescribed muscle relaxer as directed as needed for muscle aches/spasm.  Do not take this medication and drive or drink alcohol as it can make you sleepy.  Mainly use this medicine at nighttime as needed. - Apply heat 20 minutes on then 20 minutes off and perform gentle range of motion exercises to the area of greatest pain to prevent muscle stiffness and provide further pain relief.   Red flag symptoms to watch out for are numbness/tingling to the legs, weakness, loss of bowel/bladder control, and/or worsening pain that does not respond well to medicines. Follow-up with your primary care provider or return to urgent care if your symptoms do not improve in the next 3 to 4 days with medications and interventions recommended today. If your symptoms are severe (red flag), please go to the emergency room.   

## 2022-08-16 NOTE — ED Triage Notes (Signed)
Patient fell yesterday.  Patient had a water leak in her home, in the living room.  Patient fell on hard wood.  History of spinal stenosis.  Patient states she has pain in "lumbar area".  Patient is specifically asking for steroid injection.  States she doesn't take prednisone-makes her sleepy

## 2022-08-26 ENCOUNTER — Emergency Department (HOSPITAL_COMMUNITY): Payer: Medicaid Other

## 2022-08-26 ENCOUNTER — Other Ambulatory Visit: Payer: Self-pay

## 2022-08-26 ENCOUNTER — Emergency Department (HOSPITAL_COMMUNITY)
Admission: EM | Admit: 2022-08-26 | Discharge: 2022-08-26 | Disposition: A | Discharge status: Home / Self Care | Payer: Medicaid Other | Attending: Emergency Medicine | Admitting: Emergency Medicine

## 2022-08-26 DIAGNOSIS — K029 Dental caries, unspecified: Secondary | ICD-10-CM | POA: Diagnosis not present

## 2022-08-26 DIAGNOSIS — K0889 Other specified disorders of teeth and supporting structures: Secondary | ICD-10-CM | POA: Diagnosis present

## 2022-08-26 LAB — I-STAT CHEM 8, ED
BUN: 10 mg/dL (ref 6–20)
Calcium, Ion: 1.21 mmol/L (ref 1.15–1.40)
Chloride: 104 mmol/L (ref 98–111)
Creatinine, Ser: 0.7 mg/dL (ref 0.44–1.00)
Glucose, Bld: 95 mg/dL (ref 70–99)
HCT: 31 % — ABNORMAL LOW (ref 36.0–46.0)
Hemoglobin: 10.5 g/dL — ABNORMAL LOW (ref 12.0–15.0)
Potassium: 4.2 mmol/L (ref 3.5–5.1)
Sodium: 139 mmol/L (ref 135–145)
TCO2: 25 mmol/L (ref 22–32)

## 2022-08-26 LAB — CBC WITH DIFFERENTIAL/PLATELET
Abs Immature Granulocytes: 0.03 10*3/uL (ref 0.00–0.07)
Basophils Absolute: 0 10*3/uL (ref 0.0–0.1)
Basophils Relative: 0 %
Eosinophils Absolute: 0.5 10*3/uL (ref 0.0–0.5)
Eosinophils Relative: 5 %
HCT: 29.7 % — ABNORMAL LOW (ref 36.0–46.0)
Hemoglobin: 9 g/dL — ABNORMAL LOW (ref 12.0–15.0)
Immature Granulocytes: 0 %
Lymphocytes Relative: 19 %
Lymphs Abs: 2 10*3/uL (ref 0.7–4.0)
MCH: 24.9 pg — ABNORMAL LOW (ref 26.0–34.0)
MCHC: 30.3 g/dL (ref 30.0–36.0)
MCV: 82 fL (ref 80.0–100.0)
Monocytes Absolute: 0.7 10*3/uL (ref 0.1–1.0)
Monocytes Relative: 6 %
Neutro Abs: 7.6 10*3/uL (ref 1.7–7.7)
Neutrophils Relative %: 70 %
Platelets: 371 10*3/uL (ref 150–400)
RBC: 3.62 MIL/uL — ABNORMAL LOW (ref 3.87–5.11)
RDW: 16.7 % — ABNORMAL HIGH (ref 11.5–15.5)
WBC: 10.9 10*3/uL — ABNORMAL HIGH (ref 4.0–10.5)
nRBC: 0 % (ref 0.0–0.2)

## 2022-08-26 LAB — HCG, SERUM, QUALITATIVE: Preg, Serum: NEGATIVE

## 2022-08-26 MED ORDER — IOHEXOL 350 MG/ML SOLN
75.0000 mL | Freq: Once | INTRAVENOUS | Status: AC | PRN
  Administered 2022-08-26: 75 mL via INTRAVENOUS

## 2022-08-26 MED ORDER — CLINDAMYCIN HCL 150 MG PO CAPS: 450.0000 mg | ORAL_CAPSULE | Freq: Three times a day (TID) | ORAL | 0 refills | Status: AC

## 2022-08-26 MED ORDER — OXYCODONE HCL 5 MG PO TABS
5.0000 mg | ORAL_TABLET | Freq: Once | ORAL | Status: AC
  Administered 2022-08-26: 5 mg via ORAL
  Filled 2022-08-26: qty 1

## 2022-08-26 NOTE — ED Notes (Signed)
Pt went to restroom #7 close to H15. Pt is accompanied by a young girl.

## 2022-08-26 NOTE — ED Triage Notes (Addendum)
Pt

## 2022-08-26 NOTE — Discharge Instructions (Addendum)
You were seen in the ER today for evaluation of your dental pain.  I am going to prescribe you antibiotics to take.  Please take as prescribed and complete the entirety of the course.  For pain, you can take 600 mg of ibuprofen and or 1000 mg of Tylenol every 6 hours as needed.  I have included information for dentist in the area.  Please make sure to follow-up with them.  You will need to call to schedule an appointment.  I have included more affirmation of dental pain to the discharge paperwork as well.  Please review.  If you have any concerns, new or worsening symptoms, please return to your nearest emergency department for evaluation.  Contact a dentist if: You have dental pain and you do not know why. Medicine does not help your pain. Your symptoms get worse. You have new symptoms. Get help right away if: You cannot open your mouth. You are having trouble breathing or swallowing. You have a fever. Your face, neck, or jaw is swollen. These symptoms may be an emergency. Get help right away. Call your local emergency services (911 in the [place] Do not wait to see if the symptoms will go away. Do not drive yourself to the hospital.

## 2022-08-26 NOTE — ED Provider Notes (Signed)
[facility] Provider Note   CSN: [id number] Arrival date & time: 08/26/22  0930     History Chief Complaint  Patient presents with   Dental Problem    April Knight is a 47 y.o. female presents the emergency room today for evaluation of left-sided facial swelling for the past 2 weeks with dental pain.  She denies any fevers or any trouble swallowing or breathing.  Denies any trouble eating.  She reports that she has not seen a dentist in over a year.  She denies any trauma to the area.  She denies any allergies to any medications.  She reports she is been trying Advil and Tylenol for pain without much relief.  HPI     Home Medications Prior to Admission medications   Medication Sig Start Date End Date Taking? Authorizing Provider  clindamycin (CLEOCIN) 150 MG capsule Take 3 capsules (450 mg total) by mouth 3 (three) times daily for 7 days. 08/26/22 09/02/22 Yes Achille Rich, PA-C  albuterol (VENTOLIN HFA) 108 (90 Base) MCG/ACT inhaler Inhale 2 puffs into the lungs every 6 (six) hours as needed for wheezing or shortness of breath. Patient not taking: Reported on 08/16/2022 04/05/22   Rising, Lurena Joiner, PA-C  cetirizine (ZYRTEC ALLERGY) 10 MG tablet Take 1 tablet (10 mg total) by mouth daily. Patient not taking: Reported on 08/16/2022 04/17/20   Particia Nearing, PA-C  ferrous sulfate 325 (65 FE) MG tablet Take 1 tablet (325 mg total) by mouth daily. Patient not taking: Reported on 08/16/2022 04/05/22   Marita Kansas, PA-C  Flaxseed, Linseed, (FLAX SEEDS) POWD Take 1 Scoop by mouth daily. Flax Seed Powder Patient not taking: Reported on 08/16/2022    [provider]  fluticasone (FLONASE) 50 MCG/ACT nasal spray Place 1 spray into both nostrils in the morning and at bedtime. Patient not taking: Reported on 08/16/2022 04/17/20   Particia Nearing, PA-C  methocarbamol (ROBAXIN) 500 MG tablet Take 1 tablet (500 mg total) by mouth 2  (two) times daily. 08/16/22   Carlisle Beers, FNP  permethrin (ELIMITE) 5 % cream Apply to whole body once, leave on for 8 hours, rinse off in shower Patient not taking: Reported on 08/16/2022 03/28/20   Particia Nearing, PA-C  senna (SENOKOT) 8.6 MG TABS tablet Take 1 tablet (8.6 mg total) by mouth daily. Patient not taking: Reported on 08/16/2022 04/05/22   Marita Kansas, PA-C  triamcinolone ointment (KENALOG) 0.5 % Apply 1 Application topically 2 (two) times daily. Patient not taking: Reported on 08/16/2022 07/01/22   Jannifer Rodney A, FNP  TURMERIC PO Take 1 capsule by mouth daily. Turmeric Capsule Patient not taking: Reported on 08/16/2022    [provider]      Allergies    Patient has no known allergies.    Review of Systems   Review of Systems  Constitutional:  Negative for chills and fever.  HENT:  Positive for dental problem. Negative for facial swelling and trouble swallowing.   Respiratory:  Negative for shortness of breath.   Musculoskeletal:  Negative for neck pain.    Physical Exam Updated Vital Signs BP (!) 158/77   Pulse 77   Temp 98.2 F (36.8 C) (Oral)   Resp 18   Ht 5\' 8"  (1.727 m)   Wt 99.8 kg   LMP 08/10/2022 (Approximate)   SpO2 98%   BMI 33.45 kg/m  Physical Exam Vitals and nursing note reviewed.  Constitutional:  General: She is not in acute distress. HENT:     Mouth/Throat:     Mouth: Mucous membranes are moist.     Comments: Left-sided lower facial swelling noted.  No overlying erythema or fluctuance or induration.  Over the oral cavity, moist use membranes.  There is no significant pharyngeal erythema, edema, or exudate noted.  Patient has poor dentition noted throughout with multiple dental caries.  She does have some swelling noted more towards the lower left back gumline.  I do not appreciate any drainable abscess or any active drainage right now.  She has no trismus.  Her uvula is midline her airway is patent.  She is controlling  secretions and is using appropriate speech.  No sublingual elevation. Eyes:     General: No scleral icterus. Neck:     Comments: Left-sided cervical lymphadenopathy Cardiovascular:     Rate and Rhythm: Normal rate.  Pulmonary:     Effort: Pulmonary effort is normal. No respiratory distress.  Musculoskeletal:     Cervical back: Normal range of motion.  Lymphadenopathy:     Cervical: Cervical adenopathy present.  Skin:    General: Skin is warm and dry.  Neurological:     General: No focal deficit present.     Mental Status: She is alert.     Gait: Gait normal.     ED Results / Procedures / Treatments   Labs (all labs ordered are listed, but only abnormal results are displayed) Labs Reviewed  CBC WITH DIFFERENTIAL/PLATELET - Abnormal; Notable for the following components:      Result Value   WBC 10.9 (*)    RBC 3.62 (*)    Hemoglobin 9.0 (*)    HCT 29.7 (*)    MCH 24.9 (*)    RDW 16.7 (*)    All other components within normal limits  I-STAT CHEM 8, ED - Abnormal; Notable for the following components:   Hemoglobin 10.5 (*)    HCT 31.0 (*)    All other components within normal limits  HCG, SERUM, QUALITATIVE    EKG None  Radiology CT Soft Tissue Neck W Contrast  Result Date: 08/26/2022 CLINICAL DATA:  Soft tissue infection suspected EXAM: CT NECK WITH CONTRAST TECHNIQUE: Multidetector CT imaging of the neck was performed using the standard protocol following the bolus administration of intravenous contrast. RADIATION DOSE REDUCTION: This exam was performed according to the departmental dose-optimization program which includes automated exposure control, adjustment of the mA and/or kV according to patient size and/or use of iterative reconstruction technique. CONTRAST:  75mL OMNIPAQUE IOHEXOL 350 MG/ML SOLN COMPARISON:  None Available. FINDINGS: Pharynx and larynx: When accounting for motion at the level of the oropharynx and supraglottic larynx, no convincing edema. The  epiglottis (where not apposed against the lingual tonsil) appears thin on sagittal reformats. No tonsillitis or abscess. Salivary glands: No inflammation, mass, or stone. Thyroid: Normal. Lymph nodes: Thickening of upper cervical lymph nodes with adenitis appearance. No cavitation. Vascular: Unremarkable Limited intracranial: Negative Visualized orbits: Clear Mastoids and visualized paranasal sinuses: Retention cyst in the left maxillary sinus. Congested appearance of nasal mucosa with anterior septal perforation. Skeleton: Prominent degenerative ventral endplate spurring and disc space narrowing for age in the cervical spine. Mandibular molar caries. Upper chest: No acute finding IMPRESSION: 1. Limited by motion. 2. Cervical adenitis without abscess. 3. Rhinitis with nasal septal perforation. Electronically Signed   By: Tiburcio Pea M.D.   On: 08/26/2022 12:28    Procedures Procedures   Medications  Ordered in ED Medications  oxyCODONE (Oxy IR/ROXICODONE) immediate release tablet 5 mg (5 mg Oral Given 08/26/22 1135)  iohexol (OMNIPAQUE) 350 MG/ML injection 75 mL (75 mLs Intravenous Contrast Given 08/26/22 1208)    ED Course/ Medical Decision Making/ A&P                                Medical Decision Making Amount and/or Complexity of Data Reviewed Labs: ordered. Radiology: ordered.  Risk Prescription drug management.   47 y.o. female presents to the ER for evaluation of dental pain and facial swelling. Differential diagnosis includes but is not limited to dental caries, dental abscess, Ludwig angina, deep space infection. Vital signs elevated blood pressure otherwise unremarkable. Physical exam as noted above.   Concern for patient's facial swelling.  Will order CT imaging and basic lab.  I independently reviewed and interpreted the patient's labs.  CBC does show slight increase in white blood cell count however no left shift.  Anemia present however appears to be chronic for patient.   hCG is negative.  I-STAT Chem-8 does not show any electrolyte abnormality.  CT imaging shows1. Limited by motion. 2. Cervical adenitis without abscess. 3. Rhinitis with nasal septal perforation. .  Patient's adenitis likely from multiple dental caries.  Will provide the patient clindamycin to take for her symptoms and give her dental resources.  I stressed with her that she will need to follow-up with dentist as this ultimately be fixed by them.  We discussed pain management as well.  She is controlling her secretions with normal speech.  She does not have any trismus or any sublingual elevation.  Imaging did not show any Ludwig angina.  Does not appear to be in any acute distress.  She has been eating well here without any significant pain or problem.  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  We discussed the results of the labs/imaging. The plan is take prescribed dentistry. We discussed strict return precautions and red flag symptoms. The patient verbalized their understanding and agrees to the plan. The patient is stable and being discharged home in good condition.  Portions of this report may have been transcribed using voice recognition software. Every effort was made to ensure accuracy; however, inadvertent computerized transcription errors may be present.   Final Clinical Impression(s) / ED Diagnoses Final diagnoses:  Pain due to dental caries    Rx / DC Orders ED Discharge Orders          Ordered    clindamycin (CLEOCIN) 150 MG capsule  3 times daily        08/26/22 1238              Achille Rich, [place] 08/26/22 1730    Terald Sleeper, MD 08/27/22 1940

## 2022-09-28 ENCOUNTER — Encounter (HOSPITAL_BASED_OUTPATIENT_CLINIC_OR_DEPARTMENT_OTHER): Payer: Self-pay | Admitting: Emergency Medicine

## 2022-09-28 ENCOUNTER — Other Ambulatory Visit: Payer: Self-pay

## 2022-09-28 ENCOUNTER — Emergency Department (HOSPITAL_BASED_OUTPATIENT_CLINIC_OR_DEPARTMENT_OTHER)
Admission: EM | Admit: 2022-09-28 | Discharge: 2022-09-28 | Discharge status: Against Medical Advice | Payer: Medicaid Other | Attending: Emergency Medicine | Admitting: Emergency Medicine

## 2022-09-28 DIAGNOSIS — M549 Dorsalgia, unspecified: Secondary | ICD-10-CM | POA: Diagnosis present

## 2022-09-28 DIAGNOSIS — Z5321 Procedure and treatment not carried out due to patient leaving prior to being seen by health care provider: Secondary | ICD-10-CM | POA: Insufficient documentation

## 2022-09-28 DIAGNOSIS — R0981 Nasal congestion: Secondary | ICD-10-CM | POA: Diagnosis not present

## 2022-09-28 NOTE — ED Triage Notes (Addendum)
Pt arrives pov, slow gait, endorses spinal stenosis and c/o back pain. Also reports sinus congestion x 1 week. Pt declines Covid, resp swab

## 2022-09-29 ENCOUNTER — Emergency Department (HOSPITAL_BASED_OUTPATIENT_CLINIC_OR_DEPARTMENT_OTHER)
Admission: EM | Admit: 2022-09-29 | Discharge: 2022-09-29 | Discharge status: Against Medical Advice | Payer: Medicaid Other | Source: Home / Self Care

## 2023-02-27 ENCOUNTER — Telehealth: Payer: Medicaid Other | Admitting: Physician Assistant

## 2023-02-27 DIAGNOSIS — G8929 Other chronic pain: Secondary | ICD-10-CM

## 2023-02-27 NOTE — Progress Notes (Signed)
 Message sent to patient requesting further input regarding current symptoms. Awaiting patient response.

## 2023-02-27 NOTE — Progress Notes (Signed)
  Because of ongoing (chronic) pain or discomfort which is above the scope of an e-visit, I feel your condition warrants further evaluation and I recommend that you be seen in a face-to-face visit.   NOTE: There will be NO CHARGE for this E-Visit   If you are having a true medical emergency, please call 911.     For an urgent face to face visit, [facility] has multiple urgent care centers for your convenience.  Click the link below for the full list of locations and hours, walk-in wait times, appointment scheduling options and driving directions:  Urgent Care - [place], [place], [place], [place], [place], [place]  [place]     Your MyChart E-visit questionnaire answers were reviewed by a board certified advanced clinical practitioner to complete your personal care plan based on your specific symptoms.    Thank you for using e-Visits.

## 2023-04-08 ENCOUNTER — Ambulatory Visit: Admitting: Family Medicine

## 2023-04-09 ENCOUNTER — Ambulatory Visit (HOSPITAL_COMMUNITY): Admission: EM | Admit: 2023-04-09 | Discharge: 2023-04-09 | Discharge status: Against Medical Advice

## 2023-04-09 NOTE — ED Notes (Signed)
 Pt left AMA

## 2023-04-10 ENCOUNTER — Ambulatory Visit (HOSPITAL_COMMUNITY)
Admission: EM | Admit: 2023-04-10 | Discharge: 2023-04-10 | Disposition: A | Discharge status: Home / Self Care | Attending: Nurse Practitioner | Admitting: Nurse Practitioner

## 2023-04-10 DIAGNOSIS — F32A Depression, unspecified: Secondary | ICD-10-CM | POA: Insufficient documentation

## 2023-04-10 DIAGNOSIS — G8929 Other chronic pain: Secondary | ICD-10-CM | POA: Insufficient documentation

## 2023-04-10 DIAGNOSIS — Z008 Encounter for other general examination: Secondary | ICD-10-CM

## 2023-04-10 NOTE — Discharge Instructions (Addendum)

## 2023-04-10 NOTE — ED Provider Notes (Signed)
 Behavioral Health Urgent Care Medical Screening Exam  Patient Name: April Knight MRN: [id number] Date of Evaluation: 04/10/23 Chief Complaint:  " I would like to get some help for like my chronic pain".  Diagnosis:  Final diagnoses:  Encounter for psychological evaluation    History of Present illness: April Knight is a 48 y.o. female.  With psychiatric history substance abuse, who presented voluntarily as a walk-in to [facility] seeking help for her "chronic pain caused by her emotions".  Patient was seen face-to-face by this provider and chart reviewed.  Patient reports "I just would like to get some help for my chronic pain, I believe that my chronic pain is caused by something I'm going through in my emotions and head, I had a bad long-term break-up 2 years ago, and about 2 years before, it was kind of bad, so my body just reacted badly".  Patient is noted to be walking with a cane.  Patient denies illicit substance use.  She states she lives with her children and long-term boyfriend.  Patient reports that her children are currently taken away by CPS and she is looking for opportunity to seek help and get her children back".  Patient is amenable to starting therapy.  Patient reports her primary care provider suggested she might have PTSD but she does not have an official diagnosis but it was documented.  Patient reports feeling like she has depression "from everything". Patient reports "I feel like I need to stay inpatient for 2 days".   Patient has no other complaints.  Patient reports she is not established with outpatient psychiatric services for medication management or therapy and is currently not taking any medications at this time.  Patient denies history of suicide attempts or self-harm behaviors.  Patient denies history of inpatient psychiatric hospitalization.  Support, encouragement, reassurance provided about ongoing stressors.  Patient is provided with  opportunity for questions.  On evaluation, patient is alert, oriented x 4, and cooperative. Speech is clear and coherent. Pt appears casual.  Eye contact is good. Mood is euthymic, affect is congruent with mood. Thought process is coherent and thought content is WDL. Pt denies SI/HI/AVH. There is no objective indication that the patient is responding to internal stimuli. No delusions elicited during this assessment.   Patient does not meet inpatient psychiatric admission criteria at this time.  There is no evidence of imminent risk of harm to self or others.  Discussed recommendation for discharge and follow-up with [facility] outpatient psychiatric services/walk in clinic for individual therapy.  Resources provided. Patient verbalized her understanding and is in agreement.  Flowsheet Row ED from 04/10/2023 in [facility] ED from 09/28/2022 in [facility] Emergency Department at [facility] ED from 08/26/2022 in [facility] Emergency Department at [facility]  C-SSRS RISK CATEGORY No Risk No Risk No Risk       Psychiatric Specialty Exam  Presentation  General Appearance:Casual  Eye Contact:Good  Speech:Clear and Coherent  Speech Volume:Normal  Handedness:Right   Mood and Affect  Mood: Euthymic  Affect: Congruent   Thought Process  Thought Processes: Goal Directed; Coherent  Descriptions of Associations:Intact  Orientation:Full (Time, Place and Person)  Thought Content:WDL    Hallucinations:None  Ideas of Reference:None  Suicidal Thoughts:No  Homicidal Thoughts:No   Sensorium  Memory: Immediate Fair  Judgment: Fair  Insight: Fair   Executive Functions  Concentration: Good  Attention Span: Good  Recall: Fair  Fund of Knowledge: Fair  Language: Fair  Psychomotor Activity  Psychomotor Activity: Normal   Assets  Assets: Desire for Improvement; Communication Skills   Sleep   Sleep: Fair  Number of hours: No data recorded  Physical Exam: Physical Exam Constitutional:      General: She is not in acute distress.    Appearance: She is not diaphoretic.  HENT:     Right Ear: External ear normal.     Left Ear: External ear normal.     Nose: No congestion.  Eyes:     General:        Right eye: No discharge.        Left eye: No discharge.  Cardiovascular:     Rate and Rhythm: Normal rate.  Pulmonary:     Effort: No respiratory distress.  Chest:     Chest wall: No tenderness.  Neurological:     Mental Status: She is alert and oriented to person, place, and time.  Psychiatric:        Attention and Perception: Attention and perception normal.        Mood and Affect: Mood and affect normal.        Speech: Speech normal.        Behavior: Behavior is cooperative.        Thought Content: Thought content normal.    Review of Systems  Constitutional:  Negative for chills, diaphoresis and fever.  HENT:  Negative for congestion.   Eyes:  Negative for discharge.  Respiratory:  Negative for cough, shortness of breath and wheezing.   Cardiovascular:  Negative for chest pain and palpitations.  Gastrointestinal:  Negative for diarrhea, nausea and vomiting.  Neurological:  Negative for dizziness, seizures and headaches.  Psychiatric/Behavioral:  Positive for depression.    Blood pressure (!) 155/90, pulse 66, temperature 98.4 F (36.9 C), temperature source Oral, resp. rate 18, SpO2 100%, unknown if currently breastfeeding. There is no height or weight on file to calculate BMI.  Musculoskeletal: Strength & Muscle Tone: within normal limits Gait & Station: normal Patient leans: N/A   [facility] MSE Discharge Disposition for Follow up and Recommendations: Based on my evaluation the patient does not appear to have an emergency medical condition and can be discharged with resources and follow up care in outpatient services for Medication Management and Individual  Therapy  Recommend discharge home and follow up with [facility] patient walk-in clinic for individual therapy.  Resources provided.  Patient denies SI/HI/AVH or paranoia.  Patient does not meet inpatient psychiatric admission criteria or IVC criteria at this time.  There is no evidence of imminent risk of harm to self or others.  Discharge recommendations: . Please follow up with your primary care provider for all medical related needs.   Therapy: We recommend that patient participate in individual therapy to address mental health concerns.  Safety:  The patient should abstain from use of illicit substances/drugs and abuse of any medications. If symptoms worsen or do not continue to improve or if the patient becomes actively suicidal or homicidal then it is recommended that the patient return to the closest hospital emergency department, the [facility], or call 911 for further evaluation and treatment. National Suicide Prevention Lifeline 1-800-SUICIDE or 226-228-1072.  About 988 988 offers 24/7 access to trained crisis counselors who can help people experiencing mental health-related distress. People can call or text 988 or chat 988lifeline.org for themselves or if they are worried about a loved one who may need crisis support.  Crisis Mobile: Therapeutic  Alternatives:                     830-537-7559 (for crisis response 24 hours a day) [facility] Hotline:                                            604-210-2054   Patient discharged home in stable condition.  Mancel Bale, NP 04/10/2023, 7:01 AM
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
## 2023-04-13 ENCOUNTER — Telehealth: Admitting: Nurse Practitioner

## 2023-04-13 DIAGNOSIS — L739 Follicular disorder, unspecified: Secondary | ICD-10-CM

## 2023-04-13 MED ORDER — MUPIROCIN 2 % EX OINT: 1.0000 | TOPICAL_OINTMENT | Freq: Two times a day (BID) | CUTANEOUS | 0 refills | Status: AC

## 2023-04-13 NOTE — Progress Notes (Signed)
 E-Visit for Acne   We are sorry that you are experiencing this issue.  Here is how we plan to help!  Based on what you shared with me it looks like you have a blackhead from trapped hair follicles. .  Acne is a disorder of the hair follicles and oil glands (sebaceous glands). The sebaceous glands secrete oils to keep the skin moist.  When the glands get clogged, it can lead to pimples or cysts.  These cysts may become infected and leave scars. Acne is very common and normally occurs at puberty.  Acne is also inherited.  Your personal care plan consists of the following recommendations:    I have also prescribed one of the following additional therapies: Bactroban ointment. I recommend you use tweezers if you are shaving to remove trapped hairs after shaving with an electric razor  If excessive dryness or peeling occurs, reduce dose frequency or concentration of the topical scrubs.  If excessive stinging or burning occurs, remove the topical gel with mild soap and water and resume at a lower dose the next day.  Remember oral antibiotics and topical acne treatments may increase your sensitivity to the sun!  HOME CARE: Do not squeeze pimples because that can often lead to infections, worse acne, and scars. Use a moisturizer that contains retinoid or fruit acids that may inhibit the development of new acne lesions. Although there is not a clear link that foods can cause acne, doctors do believe that too many sweets predispose you to skin problems.  GET HELP RIGHT AWAY IF: If your acne gets worse or is not better within 10 days. If you become depressed. If you become pregnant, discontinue medications and call your OB/GYN.  MAKE SURE YOU: Understand these instructions. Will watch your condition. Will get help right away if you are not doing well or get worse.  Thank you for choosing an e-visit.  Your e-visit answers were reviewed by a board certified advanced clinical practitioner to  complete your personal care plan. Depending upon the condition, your plan could have included both over the counter or prescription medications.  Please review your pharmacy choice. Make sure the pharmacy is open so you can pick up prescription now. If there is a problem, you may contact your provider through [employer] and have the prescription routed to another pharmacy.  Your safety is important to [place]. If you have drug allergies check your prescription carefully.   For the next 24 hours you can use MyChart to ask questions about today's visit, request a non-urgent call back, or ask for a work or school excuse. You will get an email in the next two days asking about your experience. I hope that your e-visit has been valuable and will speed your recovery.

## 2023-04-13 NOTE — Progress Notes (Signed)
 I have spent 5 minutes in review of e-visit questionnaire, review and updating patient chart, medical decision making and response to patient.   Claiborne Rigg, NP

## 2023-04-15 ENCOUNTER — Encounter (INDEPENDENT_AMBULATORY_CARE_PROVIDER_SITE_OTHER): Payer: Self-pay

## 2023-04-22 NOTE — Therapy (Signed)
 OUTPATIENT PHYSICAL THERAPY NEURO EVALUATION   Patient Name: April Knight MRN: [id number] DOB:08-31-1975, 48 y.o., female Today's Date: 04/23/2023   PCP: Estanislado Spire PA-C REFERRING PROVIDER: Estanislado Spire PA-C  END OF SESSION:  PT End of Session - 04/23/23 0850     Visit Number 1    Number of Visits 5    Date for PT Re-Evaluation 05/23/23    Authorization Type [employer] Medicaid    PT Start Time [id number]    PT Stop Time 0928    PT Time Calculation (min) 41 min    Equipment Utilized During Treatment Gait belt    Activity Tolerance Patient tolerated treatment well    Behavior During Therapy [facility] for tasks assessed/performed             No past medical history on file. Past Surgical History:  Procedure Laterality Date   CESAREAN SECTION  1995,2004,2007,2009    x 4   CESAREAN SECTION N/A 05/22/2013   Procedure: CESAREAN SECTION;  Surgeon: Catalina Antigua, MD;  Location: [facility];  Service: Obstetrics;  Laterality: N/A;   Patient Active Problem List   Diagnosis Date Noted   S/P C-section 05/22/2013   Ventricular septal defect, fetal, affecting care of mother, antepartum 03/25/2013   Supervision of high risk pregnancy in third trimester 03/24/2013   Advanced maternal age (AMA) in pregnancy 03/17/2013   Anemia 03/17/2013   Late prenatal care 03/17/2013   Drug use complicating pregnancy 03/17/2013   Previous cesarean delivery, antepartum condition or complication 01/02/2013    ONSET DATE: 04/22/2023  REFERRING DIAG: gait and balance issues LBP from a fall   THERAPY DIAG:  History of falling  Muscle weakness (generalized)  Other abnormalities of gait and mobility  Unsteadiness on feet  Rationale for Evaluation and Treatment: Rehabilitation  SUBJECTIVE:                                                                                                                                                                                             SUBJECTIVE STATEMENT: In  December of 2019, got diagnosed with spinal stenosis and it was recommended that she get surgery. Per notes, would have been an ACDF C3-C4, C5-6. Was scared of getting surgery and did not get it. Considered it a couple times and now here she is 5 years later. Notes her walking is the same as 5 years ago. Has not had any follow up with a neurosurgeon since then. Has fallen 6 times in the past 6 months. Ambulating with some kind of cane that she got from [employer]. Got it about a month ago and it  is broken. Prior to that cane, was not using any AD to walk. Is in pain everyday, happens when she moves. Reports feels like legs are 1,000 lbs a piece  Pt accompanied by: self  PERTINENT HISTORY: PMH: severe cervical stenosis   Pt seen in ED in Dec 2019 for L arm numbness, R hand numbness, balance problems. She had a MRI of her brain and C-spine which showed severe stenosis at C5-C6 with cord edema and impingement. Neurosurgery was consulted and was given steroids and advised f/u with Neurosurgery. The patient was scheduled for ACDF of C3-C4, C5-C6 but no-showed for her pre-anesthesia appt.   PAIN:  Are you having pain? Yes: NPRS scale: 0/10, when up and moving rates pain as a 4.5/10  Pain location: Mid back  Pain description: Achy or sore Aggravating factors: Walking and moving Relieving factors: Sitting   Vitals:   04/23/23 0909  BP: 128/78  Pulse: 68     PRECAUTIONS: Fall   FALLS: Has patient fallen in last 6 months? Yes. Number of falls 6  LIVING ENVIRONMENT: Lives with:  lives with her children and their father  Lives in: House/apartment Stairs: No Has following equipment at home: Single point cane and Big foot cane that is broken (got this a  month ago)  PLOF: Independent with community mobility with device  PATIENT GOALS: Wants to walk normally, have more activity with less pain in the back   OBJECTIVE:  Note: Objective measures were completed at Evaluation unless otherwise  noted.  DIAGNOSTIC FINDINGS: MRI brain 2019: 1. Normal brain MRI.  No acute intracranial abnormality identified. 2. Left ethmoidal and sphenoid sinusitis.  MRI cervical spine 2019: severe stenosis at c5/6 with cord impingement/edema    COGNITION: Overall cognitive status: Within functional limits for tasks assessed   SENSATION: Light touch: Impaired  and pt reporting R side felt lighter than the L side  Pt reports numbness/tingling in L hand  COORDINATION: Heel to shin: more uncoordinated on LLE    POSTURE: rounded shoulders, forward head, posterior pelvic tilt, and flexed trunk    MUSCLE TONE: LLE: Hypertonic, extensor tone with LLE    LOWER EXTREMITY ROM:    Decr LLE AROM due to weakness   LOWER EXTREMITY MMT:    MMT Right Eval Left Eval  Hip flexion 4- 3+  Hip extension    Hip abduction 5 5  Hip adduction 5 5  Hip internal rotation    Hip external rotation    Knee flexion 3- (pt reporting incr pain) 3+  Knee extension 5 4+  Ankle dorsiflexion 5 5  Ankle plantarflexion    Ankle inversion    Ankle eversion    (Blank rows = not tested)     All tested in sitting.   BED MOBILITY:  Pt reporting it takes her a couple tries.   TRANSFERS: Assistive device utilized: None  Sit to stand: SBA Stand to sit: SBA Can perform with no UE support, pt reporting pain while performing  GAIT: Gait pattern: step to pattern, step through pattern, decreased step length- Right, decreased step length- Left, decreased stance time- Left, decreased stride length, decreased hip/knee flexion- Left, Right foot flat, Left foot flat, genu recurvatum- Left, and trunk flexed Distance walked: Clinic distances  Assistive device utilized:  Interior and spatial designer [facility]) Level of assistance: CGA and Min A Comments: Pt with significant L genu recurvatum, needing CGA/min A for balance with cane, discussed will trial a walker in future sessions for improved balance  Pt wearing loose slide shoes, feet  almost coming out of shoes when ambulating   FUNCTIONAL TESTS:  5 times sit to stand: 15.7 seconds with no UE support  10 meter walk test: 24.3 seconds = 1.35 ft/sec with Big Foot Cane                                                                                                                                TREATMENT DATE:  Evaluation    PATIENT EDUCATION: Education details: Clinical findings, POC, discussed need for pt to follow-up with neurosurgery as it has been 5+ years and surgery was recommended due to severe cervical stenosis with symptoms. Discussed pt would need to get a new referral from her PCP to go to the neurosurgeon. Discussed there would only be so much that PT could do in regards to her walking, but would work on making her safer with gait and having an appropriate AD  Person educated: Patient Education method: Explanation and Verbal cues Education comprehension: verbalized understanding and needs further education  HOME EXERCISE PROGRAM: Will provide at future session   GOALS: Goals reviewed with patient? Yes  SHORT TERM GOALS: ALL STGS =LTGS   LONG TERM GOALS: Target date: 05/21/2023  Pt will be independent with final HEP in order to build upon functional gains made in therapy. Baseline: no HEP  Goal status: INITIAL  2.  Pt will improve 5x sit<>stand to less than or equal to 14 sec to demonstrate improved functional strength and transfer efficiency.  Baseline: 15.7 seconds with no UE support Goal status: INITIAL  3.  Pt will improve gait speed with LRAD to at least 1.7 ft/sec in order to demo decr fall risk. Baseline: 24.3 seconds = 1.35 ft/sec with Big Foot Cane  Goal status: INITIAL  4.  Pt will ambulate at least 115' with LRAD and supervision/CGA in order to demo improved safety with gait.  Baseline: needs CGA/min A  Goal status: INITIAL    ASSESSMENT:  CLINICAL IMPRESSION: Patient is a 48 year old female referred to Neuro OPPT for gait and  balance issues and LBP from a fall.   Pt's PMH is significant for: severe cervical stenosis. Per further chart review, back in 2019 pt had a MRI of her brain and C-spine which showed severe stenosis at C5-C6 with cord edema and impingement. Neurosurgery was consulted and was given steroids and advised f/u with Neurosurgery. The patient was scheduled for ACDF of C3-C4, C5-C6 but pt did not want to have surgery. Pt had not followed up with neurosurgeon since then. Instructed pt to follow up with neurosurgery as there is not much PT can do besides make pt safer with ambulation.  The following deficits were present during the exam: impaired sensation, hypertonicity, decr strength, gait abnormalities with significant L genu recurvatum, pain, impaired posture, decr ROM. Based on 5x sit <> stand and gait speed, pt is an incr risk for falls.  Pt would benefit from skilled PT to address these impairments and functional limitations to maximize functional mobility independence   OBJECTIVE IMPAIRMENTS: Abnormal gait, decreased activity tolerance, decreased balance, decreased coordination, decreased endurance, decreased knowledge of condition, decreased knowledge of use of DME, decreased mobility, difficulty walking, decreased ROM, decreased strength, decreased safety awareness, impaired sensation, impaired tone, postural dysfunction, and pain.   ACTIVITY LIMITATIONS: carrying, lifting, bending, standing, squatting, stairs, transfers, bed mobility, locomotion level, and caring for others  PARTICIPATION LIMITATIONS: shopping, community activity, and yard work  PERSONAL FACTORS: Age, Behavior pattern, Past/current experiences, Time since onset of injury/illness/exacerbation, and 1 comorbidity: severe cervical stenosis   are also affecting patient's functional outcome.   REHAB POTENTIAL: Fair due to chronicity of deficits, lack of medical follow up   CLINICAL DECISION MAKING: Evolving/moderate complexity  EVALUATION  COMPLEXITY: Moderate  PLAN:  PT FREQUENCY: 1x/week  PT DURATION: 8 weeks  PLANNED INTERVENTIONS: 97164- PT Re-evaluation, 97110-Therapeutic exercises, 97530- Therapeutic activity, 97112- Neuromuscular re-education, 97535- Self Care, 32440- Manual therapy, [phone]- Gait training, [phone]- Orthotic Fit/training, Patient/Family education, Balance training, Stair training, and DME instructions  PLAN FOR NEXT SESSION: trial a RW for safety with gait, trial swedish knee cage, did pt reach out regarding neurosurgery appt? Give exercises for back pain    Drake Leach, PT, DPT 04/23/2023, 10:46 AM

## 2023-04-23 ENCOUNTER — Ambulatory Visit: Attending: Physician Assistant | Admitting: Physical Therapy

## 2023-04-23 VITALS — BP 128/78 | HR 68

## 2023-04-23 DIAGNOSIS — M6281 Muscle weakness (generalized): Secondary | ICD-10-CM | POA: Diagnosis present

## 2023-04-23 DIAGNOSIS — R2689 Other abnormalities of gait and mobility: Secondary | ICD-10-CM | POA: Insufficient documentation

## 2023-04-23 DIAGNOSIS — R2681 Unsteadiness on feet: Secondary | ICD-10-CM | POA: Insufficient documentation

## 2023-04-23 DIAGNOSIS — Z9181 History of falling: Secondary | ICD-10-CM | POA: Diagnosis present

## 2023-04-25 ENCOUNTER — Telehealth

## 2023-04-25 DIAGNOSIS — R32 Unspecified urinary incontinence: Secondary | ICD-10-CM

## 2023-04-25 DIAGNOSIS — G8929 Other chronic pain: Secondary | ICD-10-CM

## 2023-04-26 ENCOUNTER — Telehealth: Admitting: Family Medicine

## 2023-04-26 DIAGNOSIS — M546 Pain in thoracic spine: Secondary | ICD-10-CM

## 2023-04-26 DIAGNOSIS — R269 Unspecified abnormalities of gait and mobility: Secondary | ICD-10-CM

## 2023-04-26 DIAGNOSIS — D649 Anemia, unspecified: Secondary | ICD-10-CM | POA: Diagnosis not present

## 2023-04-26 DIAGNOSIS — G8929 Other chronic pain: Secondary | ICD-10-CM | POA: Diagnosis not present

## 2023-04-26 NOTE — Progress Notes (Signed)
 Virtual Visit Consent   [name], you are scheduled for a virtual visit with a [facility] provider today. Just as with appointments in the office, your consent must be obtained to participate. Your consent will be active for this visit and any virtual visit you may have with one of our providers in the next 365 days. If you have a MyChart account, a copy of this consent can be sent to you electronically.  As this is a virtual visit, video technology does not allow for your provider to perform a traditional examination. This may limit your provider's ability to fully assess your condition. If your provider identifies any concerns that need to be evaluated in person or the need to arrange testing (such as labs, EKG, etc.), we will make arrangements to do so. Although advances in technology are sophisticated, we cannot ensure that it will always work on either your end or our end. If the connection with a video visit is poor, the visit may have to be switched to a telephone visit. With either a video or telephone visit, we are not always able to ensure that we have a secure connection.  By engaging in this virtual visit, you consent to the provision of healthcare and authorize for your insurance to be billed (if applicable) for the services provided during this visit. Depending on your insurance coverage, you may receive a charge related to this service.  I need to obtain your verbal consent now. Are you willing to proceed with your visit today? April Knight has provided verbal consent on 04/26/2023 for a virtual visit (video or telephone). April Curio, FNP  Date: 04/26/2023 9:56 AM   Virtual Visit via Video Note   I, April Knight, connected with  April Knight  ([id number], 29-Dec-1975) on 04/26/23 at  9:45 AM EDT by a video-enabled telemedicine application and verified that I am speaking with the correct person using two identifiers.  Location: Patient: Virtual Visit  Location Patient: Home Provider: Virtual Visit Location Provider: Home Office   I discussed the limitations of evaluation and management by telemedicine and the availability of in person appointments. The patient expressed understanding and agreed to proceed.    History of Present Illness: April Knight is a 48 y.o. who identifies as a female who was assigned female at birth, and is being seen today for chronic back pain and anemia. She is requesting a walker and b12 injections. No b12 kevel available. She is advised to f/u with pcp. Marland Kitchen  HPI: HPI  Problems:  Patient Active Problem List   Diagnosis Date Noted   S/P C-section 05/22/2013   Ventricular septal defect, fetal, affecting care of mother, antepartum 03/25/2013   Supervision of high risk pregnancy in third trimester 03/24/2013   Advanced maternal age (AMA) in pregnancy 03/17/2013   Anemia 03/17/2013   Late prenatal care 03/17/2013   Drug use complicating pregnancy 03/17/2013   Previous cesarean delivery, antepartum condition or complication 01/02/2013    Allergies: No Known Allergies Medications:  Current Outpatient Medications:    mupirocin ointment (BACTROBAN) 2 %, Apply 1 Application topically 2 (two) times daily., Disp: 30 g, Rfl: 0  Observations/Objective: Patient is well-developed, well-nourished in no acute distress.  Resting comfortably  at home.  Head is normocephalic, atraumatic.  No labored breathing.  Speech is clear and coherent with logical content.  Patient is alert and oriented at baseline.    Assessment and Plan: 1. Anemia, unspecified type (Primary)  2. Impaired  gait  3. Chronic bilateral thoracic back pain  Follow up with pcp.   Follow Up Instructions: I discussed the assessment and treatment plan with the patient. The patient was provided an opportunity to ask questions and all were answered. The patient agreed with the plan and demonstrated an understanding of the instructions.  A copy  of instructions were sent to the patient via MyChart unless otherwise noted below.     The patient was advised to call back or seek an in-person evaluation if the symptoms worsen or if the condition fails to improve as anticipated.    April Curio, FNP

## 2023-04-26 NOTE — Patient Instructions (Signed)
 Anemia  Anemia is a condition in which there are not enough red blood cells or hemoglobin in the blood. Hemoglobin is a substance in red blood cells that carries oxygen. When you do not have enough red blood cells or hemoglobin (are anemic), your body cannot get enough oxygen, and your organs may not work properly. As a result, you may feel very tired or have other problems. What are the causes? Common causes of anemia include: Excessive bleeding. Anemia can be caused by excessive bleeding inside or outside the body, including bleeding from the intestines or from heavy menstrual periods in females. Poor nutrition. Long-lasting (chronic) kidney, thyroid, and liver disease. Bone marrow disorders, spleen problems, and blood disorders. Cancer and treatments for cancer. Human immunodeficiency virus (HIV) and acquired immunodeficiency syndrome (AIDS). Infections, medicines, and autoimmune disorders that destroy red blood cells. What are the signs or symptoms? Symptoms of this condition include: Minor weakness. Dizziness. Headache, or difficulties concentrating and sleeping. Heartbeats that feel irregular or faster than normal (palpitations). Shortness of breath, especially with exercise. Pale skin, lips, and nails, or cold hands and feet. Upset stomach (indigestion) and nausea. Symptoms may occur suddenly or develop slowly. If your anemia is mild, you may not have symptoms. How is this diagnosed? This condition is diagnosed based on blood tests, your medical history, and a physical exam. In some cases, a test may be needed in which cells are removed from the soft tissue inside of a bone and looked at under a microscope (bone marrow biopsy). Your health care provider may also check your stool (feces) for blood and may do more testing to look for the cause of your bleeding. Other tests may include: Imaging tests, such as a CT scan or MRI. A procedure to see inside your esophagus and stomach  (endoscopy). The esophagus is the part of the body that moves food from your mouth to your stomach. A procedure to see inside your colon and rectum (colonoscopy). How is this treated? Treatment for this condition depends on the cause. If you continue to lose a lot of blood, you may need to be treated at a hospital. Treatment may include: Taking supplements of iron, vitamin B12, or folic acid. Taking a hormone medicine (erythropoietin) that can help to stimulate red blood cell growth. Receiving donated blood through an IV (blood transfusion). This may be needed if you lose a lot of blood. Making changes to your diet. Having surgery to remove your spleen. Follow these instructions at home: Take over-the-counter and prescription medicines only as told by your health care provider. Take supplements only as told by your health care provider. Follow any diet instructions that you were given by your health care provider. Keep all follow-up visits. Your health care provider will want to recheck your blood tests. Contact a health care provider if: You develop new bleeding anywhere in the body. You are very weak. Get help right away if: You are short of breath. You have pain in your abdomen or chest. You are dizzy or feel faint. You have trouble concentrating. You have bloody stools, black stools, or tarry stools. You vomit repeatedly or you vomit up blood. These symptoms may be an emergency. Get help right away. Call 911. Do not wait to see if the symptoms will go away. Do not drive yourself to the hospital. Summary Anemia is a condition in which you do not have enough red blood cells or enough of a substance in your red blood cells that carries oxygen.  Symptoms may occur suddenly or develop slowly. If your anemia is mild, you may not have symptoms. This condition is diagnosed with blood tests, a medical history, and a physical exam. Other tests may be needed. Treatment for this condition  depends on the cause of the anemia. This information is not intended to replace advice given to you by your health care provider. Make sure you discuss any questions you have with your health care provider. Document Revised: 04/03/2021 Document Reviewed: 04/03/2021 Elsevier Patient Education  2024 [employer]Chronic Back Pain Chronic back pain is back pain that lasts longer than 3 months. The cause of your back pain may not be known. Some common causes include: Wear and tear (degenerative disease) of the bones, disks, or tissues that connect bones to each other (ligaments) in your back. Inflammation and stiffness in your back (arthritis). If you have chronic back pain, you may have times when the pain is more intense (flare-ups). You can also learn to manage the pain with home care. Follow these instructions at home: Watch for any changes in your symptoms. Take these actions to help with your pain: Managing pain and stiffness     If told, put ice on the painful area. You may be told to apply ice for the first 24-48 hours after a flare-up starts. Put ice in a plastic bag. Place a towel between your skin and the bag. Leave the ice on for 20 minutes, 2-3 times per day. If told, apply heat to the affected area as often as told by your health care provider. Use the heat source that your provider recommends, such as a moist heat pack or a heating pad. Place a towel between your skin and the heat source. Leave the heat on for 20-30 minutes. If your skin turns bright red, remove the ice or heat right away to prevent skin damage. The risk of damage is higher if you cannot feel pain, heat, or cold. Try soaking in a warm tub. Activity        Avoid bending and other activities that make the pain worse. Have good posture when you stand or sit. When you stand, keep your upper back and neck straight, with your shoulders pulled back. Avoid slouching. When you sit, keep your back straight. Relax  your shoulders. Do not round your shoulders or pull them backward. Do not sit or stand in one place for too long. Take brief periods of rest during the day. This will reduce your pain. Resting in a lying or standing position is often better than sitting to rest. When you rest for longer periods, mix in some mild activity or stretching between periods of rest. This will help to prevent stiffness and pain. Get regular exercise. Ask your provider what activities are safe for you. You may have to avoid lifting. Ask your provider how much you can safely lift. If you do lift, always use the right technique. This means you should: Bend your knees. Keep the load close to your body. Avoid twisting. Medicines Take over-the-counter and prescription medicines only as told by your provider. You may need to take medicines for pain and inflammation. These may be taken by mouth or put on the skin. You may also be given muscle relaxants. Ask your provider if the medicine prescribed to you: Requires you to avoid driving or using machinery. Can cause constipation. You may need to take these actions to prevent or treat constipation: Drink enough fluid to keep your pee (urine) pale  yellow. Take over-the-counter or prescription medicines. Eat foods that are high in fiber, such as beans, whole grains, and fresh fruits and vegetables. Limit foods that are high in fat and processed sugars, such as fried or sweet foods. General instructions  Sleep on a firm mattress in a comfortable position. Try lying on your side with your knees slightly bent. If you lie on your back, put a pillow under your knees. Do not use any products that contain nicotine or tobacco. These products include cigarettes, chewing tobacco, and vaping devices, such as e-cigarettes. If you need help quitting, ask your provider. Contact a health care provider if: You have pain that does not get better with rest or medicine. You have new pain. You  have a fever. You lose weight quickly. You have trouble doing your normal activities. You feel weak or numb in one or both of your legs or feet. Get help right away if: You are not able to control when you pee or poop. You have severe back pain and: Nausea or vomiting. Pain in your chest or abdomen. Shortness of breath. You faint. These symptoms may be an emergency. Get help right away. Call 911. Do not wait to see if the symptoms will go away. Do not drive yourself to the hospital. This information is not intended to replace advice given to you by your health care provider. Make sure you discuss any questions you have with your health care provider. Document Revised: 08/28/2021 Document Reviewed: 08/28/2021 Elsevier Patient Education  2024 [employer].

## 2023-04-26 NOTE — Progress Notes (Signed)
 Based on what you shared with me, I feel your condition warrants further evaluation as soon as possible at an Emergency department. Because you have mentioned back pain with sensory changes and incontinence, you should be evaluated for this in person.   NOTE: There will be NO CHARGE for this eVisit   If you are having a true medical emergency please call 911.      Emergency Department-[facility]  Get Driving Directions  [phone]  [address]  [place], [place] [address]  Open 24/7/365      [facility] Emergency Department at [facility]  Get Driving Directions  [address]  [place], [place] [address]  Open 24/7/365    Emergency Department- [facility]  Get Driving Directions  [phone]  [address]  [place], [place] [address]  Open 24/7/365      [facility] at [facility]  Get Driving Directions  [phone]  [address]  [place], [place] [address]  Open 24/7/365    [facility]  Emergency Department- [facility]  Get Driving Directions  [phone]  [address]  [place], [place] [address]  Open 24/7/365    [place]  Emergency Department- [facility]  Get Driving Directions  [address]  [place], [place] [address]  Open 24/7/365    [facility]  Emergency Department- [facility]  Get Driving Directions  [phone]  [address]  [place], [place] [address]  Open 24/7/365     I have spent 5 minutes in review of e-visit questionnaire, review and updating patient chart, medical decision making and response to patient.   Margaretann Loveless, PA-C

## 2023-04-29 ENCOUNTER — Telehealth: Payer: Self-pay | Admitting: Physical Therapy

## 2023-04-29 ENCOUNTER — Ambulatory Visit: Admitting: Physical Therapy

## 2023-04-29 NOTE — Telephone Encounter (Signed)
 Called pt in regards to No Show appt this morning - pt reports that she forgot about this appt. Educated pt on No Show policy. Also educated that before pt returns to PT, that she needs to go to the ER due to instruction from her PCP from sensory changes and incontinence. Pt reports that she went the other day and had testing done at the ER and are waiting for these results, per pt's chart PT unable to see these notes and notes say Admission (Canceled).   Sherlie Ban, PT, DPT 04/29/23 9:06 AM    [facility] [address] [place], [place]  [address] Phone:  [phone] Fax:  [phone]

## 2023-05-06 ENCOUNTER — Ambulatory Visit: Admitting: Physical Therapy

## 2023-05-06 ENCOUNTER — Encounter: Payer: Self-pay | Admitting: Physical Therapy

## 2023-05-06 VITALS — BP 139/90 | HR 65

## 2023-05-06 DIAGNOSIS — R2681 Unsteadiness on feet: Secondary | ICD-10-CM

## 2023-05-06 DIAGNOSIS — Z9181 History of falling: Secondary | ICD-10-CM

## 2023-05-06 DIAGNOSIS — M6281 Muscle weakness (generalized): Secondary | ICD-10-CM

## 2023-05-06 DIAGNOSIS — R2689 Other abnormalities of gait and mobility: Secondary | ICD-10-CM

## 2023-05-06 NOTE — Therapy (Signed)
 OUTPATIENT PHYSICAL THERAPY NEURO TREATMENT   Patient Name: April Knight  MRN: [id number] DOB:[birth year], 48 y.o., female Today's Date: 05/06/2023   PCP: Janita Mellow PA-C REFERRING PROVIDER: Janita Mellow PA-C  END OF SESSION:  PT End of Session - 05/06/23 0854     Visit Number 2    Number of Visits 5    Date for PT Re-Evaluation 05/23/23    Authorization Type [employer] Medicaid    PT Start Time [phone]   pt late to session   PT Stop Time 0930    PT Time Calculation (min) 38 min    Equipment Utilized During Treatment Gait belt    Activity Tolerance Patient tolerated treatment well    Behavior During Therapy [facility] for tasks assessed/performed             History reviewed. No pertinent past medical history. Past Surgical History:  Procedure Laterality Date   CESAREAN SECTION  1995,2004,2007,2009    x 4   CESAREAN SECTION N/A 05/22/2013   Procedure: CESAREAN SECTION;  Surgeon: Verlyn Goad, MD;  Location: [facility];  Service: Obstetrics;  Laterality: N/A;   Patient Active Problem List   Diagnosis Date Noted   S/P C-section 05/22/2013   Ventricular septal defect, fetal, affecting care of mother, antepartum 03/25/2013   Supervision of high risk pregnancy in third trimester 03/24/2013   Advanced maternal age (AMA) in pregnancy 03/17/2013   Anemia 03/17/2013   Late prenatal care 03/17/2013   Drug use complicating pregnancy 03/17/2013   Previous cesarean delivery, antepartum condition or complication 01/02/2013    ONSET DATE: 04/22/2023  REFERRING DIAG: gait and balance issues LBP from a fall   THERAPY DIAG:  History of falling  Muscle weakness (generalized)  Unsteadiness on feet  Other abnormalities of gait and mobility  Rationale for Evaluation and Treatment: Rehabilitation  SUBJECTIVE:                                                                                                                                                                                              SUBJECTIVE STATEMENT: Had a video visit with her PCP and was instructed to go to the ER due to back pain with sensory changes and incontinence. Pt reports that she went to the emergency room last week and left. Instead went to urgent care and reports that she had a lot of blood work done and was tested. PT can't see these results in pt's chart. Will see her PCP on Wednesday, planning on asking about a referral to the neurosurgeon.   Pt accompanied by: self  PERTINENT HISTORY: PMH: severe cervical stenosis  Pt seen in ED in Dec 2019 for L arm numbness, R hand numbness, balance problems. She had a MRI of her brain and C-spine which showed severe stenosis at C5-C6 with cord edema and impingement. Neurosurgery was consulted and was given steroids and advised f/u with Neurosurgery. The patient was scheduled for ACDF of C3-C4, C5-C6 but no-showed for her pre-anesthesia appt.   PAIN:  Are you having pain? Yes: NPRS scale: 2/10 Pain location: Mid back  Pain description: Achy or sore Aggravating factors: Walking and moving Relieving factors: Sitting   Vitals:   05/06/23 0920 05/06/23 0921  BP: (!) 152/98 (!) 139/90  Pulse: 65 65     PRECAUTIONS: Fall   FALLS: Has patient fallen in last 6 months? Yes. Number of falls 6  LIVING ENVIRONMENT: Lives with:  lives with her children and their father  Lives in: House/apartment Stairs: No Has following equipment at home: Single point cane and Big foot cane that is broken (got this a  month ago)  PLOF: Independent with community mobility with device  PATIENT GOALS: Wants to walk normally, have more activity with less pain in the back   OBJECTIVE:  Note: Objective measures were completed at Evaluation unless otherwise noted.  DIAGNOSTIC FINDINGS: MRI brain 2019: 1. Normal brain MRI.  No acute intracranial abnormality identified. 2. Left ethmoidal and sphenoid sinusitis.  MRI cervical spine 2019: severe stenosis at c5/6 with  cord impingement/edema    COGNITION: Overall cognitive status: Within functional limits for tasks assessed   SENSATION: Light touch: Impaired  and pt reporting R side felt lighter than the L side  Pt reports numbness/tingling in L hand  COORDINATION: Heel to shin: more uncoordinated on LLE    POSTURE: rounded shoulders, forward head, posterior pelvic tilt, and flexed trunk    MUSCLE TONE: LLE: Hypertonic, extensor tone with LLE    LOWER EXTREMITY ROM:    Decr LLE AROM due to weakness   LOWER EXTREMITY MMT:    MMT Right Eval Left Eval  Hip flexion 4- 3+  Hip extension    Hip abduction 5 5  Hip adduction 5 5  Hip internal rotation    Hip external rotation    Knee flexion 3- (pt reporting incr pain) 3+  Knee extension 5 4+  Ankle dorsiflexion 5 5  Ankle plantarflexion    Ankle inversion    Ankle eversion    (Blank rows = not tested)     All tested in sitting.   BED MOBILITY:  Pt reporting it takes her a couple tries.   TRANSFERS: Assistive device utilized: None  Sit to stand: SBA Stand to sit: SBA Can perform with no UE support, pt reporting pain while performing  GAIT: Gait pattern: step to pattern, step through pattern, decreased step length- Right, decreased step length- Left, decreased stance time- Left, decreased stride length, decreased hip/knee flexion- Left, Right foot flat, Left foot flat, genu recurvatum- Left, and trunk flexed Distance walked: Clinic distances  Assistive device utilized:  Interior and spatial designer (SPC) Level of assistance: CGA and Min A Comments: Pt with significant L genu recurvatum, needing CGA/min A for balance with cane, discussed will trial a walker in future sessions for improved balance   Pt wearing loose slide shoes, feet almost coming out of shoes when ambulating   FUNCTIONAL TESTS:  5 times sit to stand: 15.7 seconds with no UE support  10 meter walk test: 24.3 seconds = 1.35 ft/sec with Big Foot Cane  TREATMENT DATE:   Self-Care: Had long discussion about appropriate medical follow-up as pt had a virtual visit with PA-C last week and pt mentioned back pain with sensory changes and incontinence ad that pt should be evaluated ASAP at an Emergency Department. Reviewed this with pt with pt stating that she went to the ER, but then she left instead of being seen. Pt reports that she went to Urgent Care through her PCP and got a lot of tests done. PT unable to see these results as they are not in the [facility] system. Educated pt on following up with PA-C from last week and stating that she did not go to the ER. Also discussed importance of making sure that pt gets a new neurosurgery referral (since it has been 5 years and pt has not had any MRIs since 2019).  Educated on use of 2 wheeled RW vs cane for safety with gait and to decr fall risk. Pt asking about a rollator (did not have time to trial one during session today), but PT discussed with pt that 2 wheeled RW would be safer for pt in regards to balance. Pt planning on getting a referral for one from her PCP or going to Medical supply store to purchase one   Vitals:   05/06/23 0920 05/06/23 0921  BP: (!) 152/98 (!) 139/90  Pulse: 65 65   Pt reports that she is not on any BP medication and does not check her BP at home. Educated on normative BP values and for pt to get a BP cuff to monitor at home. Wrote down these values so pt could show her PCP   GAIT: Gait pattern: step to pattern, step through pattern, decreased step length- Right, decreased step length- Left, decreased stance time- Left, decreased stride length, decreased hip/knee flexion- Left, Right foot flat, Left foot flat, genu recurvatum- Left, and trunk flexed Distance walked: Clinic distances  Assistive device utilized: Interior and spatial designer [facility]) Level of assistance: CGA and Min A Comments: Pt  with significant L genu recurvatum, needing CGA/min A for balance with cane  Trialed a 2 wheeled RW with pt ambulating 230' x 1 with pt with scissoring gait, narrow BOS, decr foot clearance with RLE and LLE and significant genu recurvatum with RLE. Despite gait abnormalities, pt able to ambulate with supervision and is safer instead of using SPC. Educated that this would be the best AD for pt at this time   PATIENT EDUCATION: Education details: See Self-Care section above  Person educated: Patient Education method: Explanation, Verbal cues, and Handouts Education comprehension: verbalized understanding and needs further education  HOME EXERCISE PROGRAM: Will provide at future session   GOALS: Goals reviewed with patient? Yes  SHORT TERM GOALS: ALL STGS =LTGS   LONG TERM GOALS: Target date: 05/21/2023  Pt will be independent with final HEP in order to build upon functional gains made in therapy. Baseline: no HEP  Goal status: INITIAL  2.  Pt will improve 5x sit<>stand to less than or equal to 14 sec to demonstrate improved functional strength and transfer efficiency.  Baseline: 15.7 seconds with no UE support Goal status: INITIAL  3.  Pt will improve gait speed with LRAD to at least 1.7 ft/sec in order to demo decr fall risk. Baseline: 24.3 seconds = 1.35 ft/sec with Big Foot Cane  Goal status: INITIAL  4.  Pt will ambulate at least 115' with LRAD and supervision/CGA in order to demo improved safety with gait.  Baseline: needs  CGA/min A  Goal status: INITIAL    ASSESSMENT:  CLINICAL IMPRESSION: Pt had video visit with family medicine on 04/25/23 and was instructed to go to the ER for further medical work-up due to pt having back pain with sensory changes and incontinence. Pt left the ER and went to urgent care instead through her PCP and pt reports she had a lot of testing done. PT unable to see these results. Pt reports she follows up next week. Today's skilled session focused  heavily on education on appropriate medical follow-up, especially with neurosurgery as pt was scheduled for ACDF in 2020 due to severe stenosis with cord edema/impingement, but pt did not want to have surgery. Pt has not had any follow-up with neurosurgery or new imaging since then. Educated how pt's deficits are in regards to pt's severe stenosis. Trialed RW today for improved gait stability and safety compared to a SPC. Pt planning on figuring a way to obtain a RW. Will continue per POC.     OBJECTIVE IMPAIRMENTS: Abnormal gait, decreased activity tolerance, decreased balance, decreased coordination, decreased endurance, decreased knowledge of condition, decreased knowledge of use of DME, decreased mobility, difficulty walking, decreased ROM, decreased strength, decreased safety awareness, impaired sensation, impaired tone, postural dysfunction, and pain.   ACTIVITY LIMITATIONS: carrying, lifting, bending, standing, squatting, stairs, transfers, bed mobility, locomotion level, and caring for others  PARTICIPATION LIMITATIONS: shopping, community activity, and yard work  PERSONAL FACTORS: Age, Behavior pattern, Past/current experiences, Time since onset of injury/illness/exacerbation, and 1 comorbidity: severe cervical stenosis   are also affecting patient's functional outcome.   REHAB POTENTIAL: Fair due to chronicity of deficits, lack of medical follow up   CLINICAL DECISION MAKING: Evolving/moderate complexity  EVALUATION COMPLEXITY: Moderate  PLAN:  PT FREQUENCY: 1x/week  PT DURATION: 8 weeks  PLANNED INTERVENTIONS: 97164- PT Re-evaluation, 97110-Therapeutic exercises, 97530- Therapeutic activity, 97112- Neuromuscular re-education, 97535- Self Care, 46962- Manual therapy, [phone]- Gait training, [phone]- Orthotic Fit/training, Patient/Family education, Balance training, Stair training, and DME instructions  PLAN FOR NEXT SESSION: trial a RW for safety with gait, trial swedish knee cage, did  pt reach out regarding neurosurgery appt? Give exercises for back pain   How was appt with PCP?    Seabron Cypress, PT, DPT 05/06/2023, 9:59 AM

## 2023-05-13 ENCOUNTER — Inpatient Hospital Stay

## 2023-05-13 ENCOUNTER — Other Ambulatory Visit: Payer: Self-pay | Admitting: *Deleted

## 2023-05-13 ENCOUNTER — Encounter: Payer: Self-pay | Admitting: Hematology and Oncology

## 2023-05-13 ENCOUNTER — Ambulatory Visit: Admitting: Physical Therapy

## 2023-05-13 ENCOUNTER — Inpatient Hospital Stay: Attending: Hematology and Oncology | Admitting: Hematology and Oncology

## 2023-05-13 VITALS — BP 147/76 | HR 70 | Temp 98.0°F | Resp 16 | Wt 218.3 lb

## 2023-05-13 DIAGNOSIS — G8929 Other chronic pain: Secondary | ICD-10-CM | POA: Diagnosis not present

## 2023-05-13 DIAGNOSIS — F17210 Nicotine dependence, cigarettes, uncomplicated: Secondary | ICD-10-CM | POA: Diagnosis not present

## 2023-05-13 DIAGNOSIS — R21 Rash and other nonspecific skin eruption: Secondary | ICD-10-CM | POA: Diagnosis not present

## 2023-05-13 DIAGNOSIS — D509 Iron deficiency anemia, unspecified: Secondary | ICD-10-CM

## 2023-05-13 DIAGNOSIS — F5089 Other specified eating disorder: Secondary | ICD-10-CM | POA: Insufficient documentation

## 2023-05-13 DIAGNOSIS — K5900 Constipation, unspecified: Secondary | ICD-10-CM | POA: Insufficient documentation

## 2023-05-13 DIAGNOSIS — Z79899 Other long term (current) drug therapy: Secondary | ICD-10-CM | POA: Insufficient documentation

## 2023-05-13 DIAGNOSIS — R231 Pallor: Secondary | ICD-10-CM | POA: Insufficient documentation

## 2023-05-13 DIAGNOSIS — M4800 Spinal stenosis, site unspecified: Secondary | ICD-10-CM | POA: Insufficient documentation

## 2023-05-13 DIAGNOSIS — K921 Melena: Secondary | ICD-10-CM | POA: Insufficient documentation

## 2023-05-13 DIAGNOSIS — R0602 Shortness of breath: Secondary | ICD-10-CM | POA: Diagnosis not present

## 2023-05-13 LAB — CBC WITH DIFFERENTIAL (CANCER CENTER ONLY)
Abs Immature Granulocytes: 0.01 10*3/uL (ref 0.00–0.07)
Basophils Absolute: 0 10*3/uL (ref 0.0–0.1)
Basophils Relative: 1 %
Eosinophils Absolute: 0.1 10*3/uL (ref 0.0–0.5)
Eosinophils Relative: 2 %
HCT: 27 % — ABNORMAL LOW (ref 36.0–46.0)
Hemoglobin: 8.2 g/dL — ABNORMAL LOW (ref 12.0–15.0)
Immature Granulocytes: 0 %
Lymphocytes Relative: 30 %
Lymphs Abs: 1.6 10*3/uL (ref 0.7–4.0)
MCH: 22 pg — ABNORMAL LOW (ref 26.0–34.0)
MCHC: 30.4 g/dL (ref 30.0–36.0)
MCV: 72.6 fL — ABNORMAL LOW (ref 80.0–100.0)
Monocytes Absolute: 0.4 10*3/uL (ref 0.1–1.0)
Monocytes Relative: 8 %
Neutro Abs: 3.1 10*3/uL (ref 1.7–7.7)
Neutrophils Relative %: 59 %
Platelet Count: 338 10*3/uL (ref 150–400)
RBC: 3.72 MIL/uL — ABNORMAL LOW (ref 3.87–5.11)
RDW: 18.7 % — ABNORMAL HIGH (ref 11.5–15.5)
WBC Count: 5.3 10*3/uL (ref 4.0–10.5)
nRBC: 0 % (ref 0.0–0.2)

## 2023-05-13 LAB — IRON AND IRON BINDING CAPACITY (CC-WL,HP ONLY)
Iron: 15 ug/dL — ABNORMAL LOW (ref 28–170)
Saturation Ratios: 3 % — ABNORMAL LOW (ref 10.4–31.8)
TIBC: 574 ug/dL — ABNORMAL HIGH (ref 250–450)
UIBC: 559 ug/dL — ABNORMAL HIGH (ref 148–442)

## 2023-05-13 LAB — FERRITIN: Ferritin: 3 ng/mL — ABNORMAL LOW (ref 11–307)

## 2023-05-13 NOTE — Progress Notes (Signed)
 [facility] CONSULT NOTE  Patient Care Team: Center, [facility] as PCP - General  CHIEF COMPLAINTS/PURPOSE OF CONSULTATION:  Anemia.  ASSESSMENT & PLAN:   Assessment and Plan Assessment & Plan Iron  Deficiency Anemia Chronic iron  deficiency anemia with fatigue, dyspnea, and pica. Anemia persists over 20 years with no apparent blood loss. Oral iron  causes constipation, leading to non-compliance. Colonoscopy recommended due to age. IV iron  considered due to oral intolerance, with informed consent on anaphylaxis risk. - Order IV iron  infusion pending insurance authorization. - Schedule colonoscopy to evaluate for gastrointestinal bleeding. - Discuss potential side effects of IV iron , including rare risk of anaphylaxis. - Advise follow-up with primary care provider for B12 evaluation and potential supplementation.  Spinal Stenosis Chronic spinal stenosis causing pain and affecting mobility. - Recommend continuation of current management and consider referral to a specialist if symptoms worsen.  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 - Discuss with PCP for referral to a dermatologist for further evaluation.   HISTORY OF PRESENTING ILLNESS:   April Knight  48 y.o. female is here because of anemia.  Discussed the use of AI scribe software for clinical note transcription with the patient, who gave verbal consent to proceed.  History of Present Illness April Knight  is a 47 year old female with chronic anemia who presents for follow-up regarding low iron  levels. She was referred by Dr. Linnell Richardson for evaluation of persistent low iron  levels.  She feels significantly better since her last visit over a year ago, with increased activity levels. Despite this improvement, recent blood work indicates continued low iron  levels. She has not received IV iron  therapy and avoids oral iron  supplements due to constipation.  She  experiences symptoms of anemia, including fatigue, shortness of breath, and pica, with cravings for non-nutritive substances like stones and clay. No palpitations or blood in her stool, though she noted black stools after consuming molasses, attributing it to the molasses. Her menstrual cycles are regular and not heavy, and she maintains a diet rich in meat. Despite these factors, she has been anemic for at least 20 years.  She suffers from chronic pain due to spinal stenosis, affecting her mobility and balance, leading to a slow gait. No swelling is present, and she has not been diagnosed with lupus or rheumatoid arthritis. She has noticed a sudden darkening of her elbows and a lacy rash.  She inquired about B12 shots, noting they were not ordered during this visit.  Rest of the pertinent 10 point ROS reviewed and negative  REVIEW OF SYSTEMS:   Constitutional: Denies fevers, chills or abnormal night sweats Eyes: Denies blurriness of vision, double vision or watery eyes Ears, nose, mouth, throat, and face: Denies mucositis or sore throat Respiratory: Denies cough, dyspnea or wheezes Cardiovascular: Denies palpitation, chest discomfort or lower extremity swelling Gastrointestinal:  Denies nausea, heartburn or change in bowel habits Skin: Denies abnormal skin rashes Lymphatics: Denies new lymphadenopathy or easy bruising Neurological:Denies numbness, tingling or new weaknesses Behavioral/Psych: Mood is stable, no new changes  All other systems were reviewed with the patient and are negative.  MEDICAL HISTORY:  No past medical history on file.  SURGICAL HISTORY: Past Surgical History:  Procedure Laterality Date   CESAREAN SECTION  1995,2004,2007,2009    x 4   CESAREAN SECTION N/A 05/22/2013   Procedure: CESAREAN SECTION;  Surgeon: Verlyn Goad, MD;  Location: [facility];  Service: Obstetrics;  Laterality: N/A;    SOCIAL HISTORY: Social History  Socioeconomic History   Marital status:  Single    Spouse name: Not on file   Number of children: Not on file   Years of education: Not on file   Highest education level: Not on file  Occupational History   Not on file  Tobacco Use   Smoking status: Every Day    Current packs/day: 0.25    Average packs/day: 0.3 packs/day for 10.0 years (2.5 ttl pk-yrs)    Types: Cigarettes   Smokeless tobacco: Never  Vaping Use   Vaping status: Never Used  Substance and Sexual Activity   Alcohol use: Yes   Drug use: Not Currently    Types: Cocaine, Marijuana   Sexual activity: Yes    Birth control/protection: None  Other Topics Concern   Not on file  Social History Narrative   Not on file   Social Drivers of Health   Financial Resource Strain: Not on file  Food Insecurity: Not on file  Transportation Needs: Not on file  Physical Activity: Not on file  Stress: Not on file  Social Connections: Not on file  Intimate Partner Violence: Not on file    FAMILY HISTORY: No family history on file.  ALLERGIES:  has no known allergies.  MEDICATIONS:  Current Outpatient Medications  Medication Sig Dispense Refill   mupirocin  ointment (BACTROBAN ) 2 % Apply 1 Application topically 2 (two) times daily. 30 g 0   No current facility-administered medications for this visit.     PHYSICAL EXAMINATION: ECOG PERFORMANCE STATUS: 0 - Asymptomatic  Vitals:   05/13/23 0950  BP: (!) 147/76  Pulse: 70  Resp: 16  Temp: 98 F (36.7 C)  SpO2: 100%   Filed Weights   05/13/23 0950  Weight: 218 lb 4.8 oz (99 kg)    GENERAL:alert, no distress and comfortable, walks slowly CTA bilaterally RRR No LE edema.  LABORATORY DATA:  I have reviewed the data as listed Lab Results  Component Value Date   WBC 5.3 05/13/2023   HGB 8.2 (L) 05/13/2023   HCT 27.0 (L) 05/13/2023   MCV 72.6 (L) 05/13/2023   PLT 338 05/13/2023     Chemistry      Component Value Date/Time   NA 139 08/26/2022 1119   K 4.2 08/26/2022 1119   CL 104 08/26/2022  1119   CO2 26 04/05/2022 1854   BUN 10 08/26/2022 1119   CREATININE 0.70 08/26/2022 1119      Component Value Date/Time   CALCIUM 9.1 04/05/2022 1854   ALKPHOS 47 01/14/2018 1953   AST 44 (H) 01/14/2018 1953   ALT 30 01/14/2018 1953   BILITOT 1.2 01/14/2018 1953       RADIOGRAPHIC STUDIES: I have personally reviewed the radiological images as listed and agreed with the findings in the report. No results found.  All questions were answered. The patient knows to call the clinic with any problems, questions or concerns. I spent 30 minutes in the care of this patient including H and P, review of records, counseling and coordination of care.     Murleen Arms, MD 05/13/2023 10:06 AM

## 2023-05-13 NOTE — Progress Notes (Signed)
 April Knight CONSULT NOTE  Patient Care Team: Knight, [facility] as PCP - General  CHIEF COMPLAINTS/PURPOSE OF CONSULTATION:  Anemia.  ASSESSMENT & PLAN:   This is a very pleasant 48 year old premenopausal female patient with newly diagnosed iron deficiency anemia referred to hematology for additional recommendations.  Assessment and Plan Assessment & Plan      HISTORY OF PRESENTING ILLNESS:   April Knight  48 y.o. female is here because of anemia.  This is a very pleasant 48 year old female patient with past medical history significant for anemia, recreational drug abuse, who is here for follow up.  Discussed the use of AI scribe software for clinical note transcription with the patient, who gave verbal consent to proceed.  History of Present Illness     Rest of the pertinent 10 point ROS reviewed and negative  REVIEW OF SYSTEMS:   Constitutional: Denies fevers, chills or abnormal night sweats Eyes: Denies blurriness of vision, double vision or watery eyes Ears, nose, mouth, throat, and face: Denies mucositis or sore throat Respiratory: Denies cough, dyspnea or wheezes Cardiovascular: Denies palpitation, chest discomfort or lower extremity swelling Gastrointestinal:  Denies nausea, heartburn or change in bowel habits Skin: Denies abnormal skin rashes Lymphatics: Denies new lymphadenopathy or easy bruising Neurological:Denies numbness, tingling or new weaknesses Behavioral/Psych: Mood is stable, no new changes  All other systems were reviewed with the patient and are negative.  MEDICAL HISTORY:  No past medical history on file.  SURGICAL HISTORY: Past Surgical History:  Procedure Laterality Date   CESAREAN SECTION  1995,2004,2007,2009    x 4   CESAREAN SECTION N/A 05/22/2013   Procedure: CESAREAN SECTION;  Surgeon: Verlyn Goad, MD;  Location: [facility];  Service: Obstetrics;  Laterality: N/A;    SOCIAL HISTORY: Social History    Socioeconomic History   Marital status: Single    Spouse name: Not on file   Number of children: Not on file   Years of education: Not on file   Highest education level: Not on file  Occupational History   Not on file  Tobacco Use   Smoking status: Every Day    Current packs/day: 0.25    Average packs/day: 0.3 packs/day for 10.0 years (2.5 ttl pk-yrs)    Types: Cigarettes   Smokeless tobacco: Never  Vaping Use   Vaping status: Never Used  Substance and Sexual Activity   Alcohol use: Yes   Drug use: Not Currently    Types: Cocaine, Marijuana   Sexual activity: Yes    Birth control/protection: None  Other Topics Concern   Not on file  Social History Narrative   Not on file   Social Drivers of Health   Financial Resource Strain: Not on file  Food Insecurity: Not on file  Transportation Needs: Not on file  Physical Activity: Not on file  Stress: Not on file  Social Connections: Not on file  Intimate Partner Violence: Not on file    FAMILY HISTORY: No family history on file.  ALLERGIES:  has no known allergies.  MEDICATIONS:  Current Outpatient Medications  Medication Sig Dispense Refill   mupirocin  ointment (BACTROBAN ) 2 % Apply 1 Application topically 2 (two) times daily. 30 g 0   No current facility-administered medications for this visit.     PHYSICAL EXAMINATION: ECOG PERFORMANCE STATUS: 0 - Asymptomatic  Vitals:   05/13/23 0950  BP: (!) 147/76  Pulse: 70  Resp: 16  Temp: 98 F (36.7 C)  SpO2: 100%   Filed Weights  05/13/23 0950  Weight: 218 lb 4.8 oz (99 kg)    GENERAL:alert, no distress and comfortable SKIN: skin color, texture, turgor are normal, no rashes or significant lesions EYES: normal, conjunctiva are pink and non-injected, sclera clear OROPHARYNX:no exudate, no erythema and lips, buccal mucosa, and tongue normal  NECK: supple, thyroid normal size, non-tender, without nodularity LYMPH:  no palpable lymphadenopathy in the  cervical, axillary LUNGS: clear to auscultation and percussion with normal breathing effort HEART: regular rate & rhythm and no murmurs and no lower extremity edema ABDOMEN:abdomen soft, non-tender and normal bowel sounds Musculoskeletal:no cyanosis of digits and no clubbing  PSYCH: alert & oriented x 3 with fluent speech NEURO: no focal motor/sensory deficits  LABORATORY DATA:  I have reviewed the data as listed Lab Results  Component Value Date   WBC 5.3 05/13/2023   HGB 8.2 (L) 05/13/2023   HCT 27.0 (L) 05/13/2023   MCV 72.6 (L) 05/13/2023   PLT 338 05/13/2023     Chemistry      Component Value Date/Time   NA 139 08/26/2022 1119   K 4.2 08/26/2022 1119   CL 104 08/26/2022 1119   CO2 26 04/05/2022 1854   BUN 10 08/26/2022 1119   CREATININE 0.70 08/26/2022 1119      Component Value Date/Time   CALCIUM 9.1 04/05/2022 1854   ALKPHOS 47 01/14/2018 1953   AST 44 (H) 01/14/2018 1953   ALT 30 01/14/2018 1953   BILITOT 1.2 01/14/2018 1953       RADIOGRAPHIC STUDIES: I have personally reviewed the radiological images as listed and agreed with the findings in the report. No results found.  All questions were answered. The patient knows to call the clinic with any problems, questions or concerns. I spent  minutes in the care of this patient including H and P, review of records, counseling and coordination of care.     Murleen Arms, MD 05/13/2023 10:06 AM

## 2023-05-20 ENCOUNTER — Ambulatory Visit: Admitting: Physical Therapy

## 2023-05-20 ENCOUNTER — Inpatient Hospital Stay

## 2023-05-20 VITALS — BP 146/85 | HR 70 | Temp 98.4°F | Resp 16

## 2023-05-20 DIAGNOSIS — D509 Iron deficiency anemia, unspecified: Secondary | ICD-10-CM

## 2023-05-20 MED ORDER — SODIUM CHLORIDE 0.9 % IV SOLN: INTRAVENOUS | Status: DC

## 2023-05-20 MED ORDER — SODIUM CHLORIDE 0.9 % IV SOLN
300.0000 mg | Freq: Once | INTRAVENOUS | Status: AC
  Administered 2023-05-20: 300.0065 mg via INTRAVENOUS
  Filled 2023-05-20: qty 300

## 2023-05-20 NOTE — Progress Notes (Signed)
 Pt observed for 30 min post venofer infusion. Tolerated well. No adverse s/s. VSS at d.c. AVS reviewed. Pt d/c ambulatory with cane and family.

## 2023-05-20 NOTE — Patient Instructions (Signed)

## 2023-05-24 ENCOUNTER — Emergency Department (HOSPITAL_COMMUNITY)
Admission: EM | Admit: 2023-05-24 | Discharge: 2023-05-24 | Disposition: A | Discharge status: Home / Self Care | Attending: Emergency Medicine | Admitting: Emergency Medicine

## 2023-05-24 ENCOUNTER — Other Ambulatory Visit: Payer: Self-pay

## 2023-05-24 ENCOUNTER — Emergency Department (HOSPITAL_COMMUNITY)

## 2023-05-24 ENCOUNTER — Encounter (HOSPITAL_COMMUNITY): Payer: Self-pay

## 2023-05-24 DIAGNOSIS — S53124A Posterior dislocation of right ulnohumeral joint, initial encounter: Secondary | ICD-10-CM | POA: Diagnosis not present

## 2023-05-24 DIAGNOSIS — M25521 Pain in right elbow: Secondary | ICD-10-CM | POA: Diagnosis present

## 2023-05-24 DIAGNOSIS — Z87891 Personal history of nicotine dependence: Secondary | ICD-10-CM | POA: Insufficient documentation

## 2023-05-24 DIAGNOSIS — Y92410 Unspecified street and highway as the place of occurrence of the external cause: Secondary | ICD-10-CM | POA: Diagnosis not present

## 2023-05-24 LAB — RAPID URINE DRUG SCREEN, HOSP PERFORMED
Amphetamines: NOT DETECTED
Barbiturates: NOT DETECTED
Benzodiazepines: NOT DETECTED
Cocaine: NOT DETECTED
Opiates: NOT DETECTED
Tetrahydrocannabinol: NOT DETECTED

## 2023-05-24 LAB — COMPREHENSIVE METABOLIC PANEL WITH GFR
ALT: 20 U/L (ref 0–44)
AST: 33 U/L (ref 15–41)
Albumin: 3.8 g/dL (ref 3.5–5.0)
Alkaline Phosphatase: 45 U/L (ref 38–126)
Anion gap: 8 (ref 5–15)
BUN: 13 mg/dL (ref 6–20)
CO2: 23 mmol/L (ref 22–32)
Calcium: 9.2 mg/dL (ref 8.9–10.3)
Chloride: 106 mmol/L (ref 98–111)
Creatinine, Ser: 0.85 mg/dL (ref 0.44–1.00)
GFR, Estimated: >60 mL/min (ref >60)
Glucose, Bld: 89 mg/dL (ref 70–99)
Potassium: 4.1 mmol/L (ref 3.5–5.1)
Sodium: 137 mmol/L (ref 135–145)
Total Bilirubin: 0.7 mg/dL (ref 0.0–1.2)
Total Protein: 7.2 g/dL (ref 6.5–8.1)

## 2023-05-24 LAB — I-STAT CHEM 8, ED
BUN: 18 mg/dL (ref 6–20)
Calcium, Ion: 1.17 mmol/L (ref 1.15–1.40)
Chloride: 106 mmol/L (ref 98–111)
Creatinine, Ser: 0.9 mg/dL (ref 0.44–1.00)
Glucose, Bld: 89 mg/dL (ref 70–99)
HCT: 31 % — ABNORMAL LOW (ref 36.0–46.0)
Hemoglobin: 10.5 g/dL — ABNORMAL LOW (ref 12.0–15.0)
Potassium: 4.2 mmol/L (ref 3.5–5.1)
Sodium: 139 mmol/L (ref 135–145)
TCO2: 25 mmol/L (ref 22–32)

## 2023-05-24 LAB — CBC WITH DIFFERENTIAL/PLATELET
Abs Immature Granulocytes: 0.04 10*3/uL (ref 0.00–0.07)
Basophils Absolute: 0 10*3/uL (ref 0.0–0.1)
Basophils Relative: 0 %
Eosinophils Absolute: 0.1 10*3/uL (ref 0.0–0.5)
Eosinophils Relative: 1 %
HCT: 30.1 % — ABNORMAL LOW (ref 36.0–46.0)
Hemoglobin: 8.9 g/dL — ABNORMAL LOW (ref 12.0–15.0)
Immature Granulocytes: 0 %
Lymphocytes Relative: 14 %
Lymphs Abs: 1.3 10*3/uL (ref 0.7–4.0)
MCH: 22.9 pg — ABNORMAL LOW (ref 26.0–34.0)
MCHC: 29.6 g/dL — ABNORMAL LOW (ref 30.0–36.0)
MCV: 77.4 fL — ABNORMAL LOW (ref 80.0–100.0)
Monocytes Absolute: 0.5 10*3/uL (ref 0.1–1.0)
Monocytes Relative: 5 %
Neutro Abs: 7.6 10*3/uL (ref 1.7–7.7)
Neutrophils Relative %: 80 %
Platelets: 272 10*3/uL (ref 150–400)
RBC: 3.89 MIL/uL (ref 3.87–5.11)
RDW: 20 % — ABNORMAL HIGH (ref 11.5–15.5)
WBC: 9.5 10*3/uL (ref 4.0–10.5)
nRBC: 0 % (ref 0.0–0.2)

## 2023-05-24 LAB — ETHANOL: Alcohol, Ethyl (B): <15 mg/dL (ref <15)

## 2023-05-24 LAB — HCG, SERUM, QUALITATIVE: Preg, Serum: NEGATIVE

## 2023-05-24 MED ORDER — PROPOFOL 10 MG/ML IV BOLUS
1.0000 mg/kg | Freq: Once | INTRAVENOUS | Status: AC
  Administered 2023-05-24: 50 mg via INTRAVENOUS
  Administered 2023-05-24: 30 mg via INTRAVENOUS
  Filled 2023-05-24: qty 20

## 2023-05-24 MED ORDER — HYDROCODONE-ACETAMINOPHEN 5-325 MG PO TABS: 1.0000 | ORAL_TABLET | Freq: Four times a day (QID) | ORAL | 0 refills | Status: AC | PRN

## 2023-05-24 MED ORDER — HYDROMORPHONE HCL 1 MG/ML IJ SOLN
1.0000 mg | Freq: Once | INTRAMUSCULAR | Status: AC
  Administered 2023-05-24: 1 mg via INTRAVENOUS
  Filled 2023-05-24: qty 1
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
## 2023-05-24 NOTE — Discharge Instructions (Addendum)
 Please use Tylenol  or ibuprofen  for pain.  You may use 600 mg ibuprofen  every 6 hours or 1000 mg of Tylenol  every 6 hours.  You may choose to alternate between the 2.  This would be most effective.  Not to exceed 4 g of Tylenol  within 24 hours.  Not to exceed 3200 mg ibuprofen  24 hours.  You can use the stronger narcotic pain medication in place of the Tylenol  for severe breakthrough pain.  Please follow-up closely with the orthopedic physician's contact information provided above.  You can use the stronger narcotic pain medication in place of Tylenol  for severe break through pain.  If you take the narcotic pain medication that we prescribed recommend that you also take a laxative such as MiraLAX or Dulcolax every day that you take the narcotic pain medicine, and drink plenty of fluids, 50 to 64 ounces to prevent any constipation.  Please call the orthopedic physician for close follow-up at your earliest convenience.
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
## 2023-05-24 NOTE — ED Notes (Signed)
 CCM called for tele admit.

## 2023-05-24 NOTE — Progress Notes (Signed)
 RT assisted with a conscious sedation. Pt tolerated procedure well without any complications.
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
## 2023-05-24 NOTE — Progress Notes (Signed)
 Orthopedic Tech Progress Note Patient Details:  April Knight  Apr 03, 1975 [id number]  Ortho Devices Type of Ortho Device: Ace wrap, Cotton web roll, Long arm splint Ortho Device/Splint Location: RUE Ortho Device/Splint Interventions: Ordered, Application, Adjustment   Post Interventions Patient Tolerated: Well Instructions Provided: Care of device  Eyan Hagood L Melisia Leming 05/24/2023, 1:53 PM

## 2023-05-25 NOTE — ED Provider Notes (Signed)
.  Sedation  Date/Time: 05/24/2023 2:00 PM  Performed by: Dorenda Gandy, MD Authorized by: Dorenda Gandy, MD   Consent:    Consent obtained:  Verbal   Consent given by:  Patient   Risks discussed:  Dysrhythmia, inadequate sedation, nausea, vomiting, respiratory compromise necessitating ventilatory assistance and intubation and prolonged hypoxia resulting in organ damage   Alternatives discussed:  Analgesia without sedation Universal protocol:    Procedure explained and questions answered to patient or proxy's satisfaction: yes     Relevant documents present and verified: yes     Imaging studies available: yes     Required blood products, implants, devices, and special equipment available: yes     Immediately prior to procedure, a time out was called: yes     Patient identity confirmed:  Verbally with patient Indications:    Procedure performed:  Dislocation reduction   Procedure necessitating sedation performed by:  Physician performing sedation Pre-sedation assessment:    Time since last food or drink:  6   ASA classification: class 1 - normal, healthy patient     Mouth opening:  3 or more finger widths   Thyromental distance:  3 finger widths   Mallampati score:  I - soft palate, uvula, fauces, pillars visible   Neck mobility: normal     Pre-sedation assessments completed and reviewed: airway patency, cardiovascular function, hydration status, mental status, nausea/vomiting and pain level   A pre-sedation assessment was completed prior to the start of the procedure Immediate pre-procedure details:    Reassessment: Patient reassessed immediately prior to procedure     Reviewed: vital signs     Verified: bag valve mask available, emergency equipment available, intubation equipment available, IV patency confirmed, oxygen available and suction available   Procedure details (see MAR for exact dosages):    Preoxygenation:  Nasal cannula   Sedation:  Propofol    Intended level of  sedation: deep   Analgesia:  Hydromorphone    Intra-procedure monitoring:  Blood pressure monitoring, cardiac monitor, continuous pulse oximetry, continuous capnometry, frequent LOC assessments and frequent vital sign checks   Intra-procedure events: none     Total Provider sedation time (minutes):  15 Post-procedure details:   A post-sedation assessment was completed following the completion of the procedure.   Attendance: Constant attendance by certified staff until patient recovered     Recovery: Patient returned to pre-procedure baseline     Post-sedation assessments completed and reviewed: airway patency, cardiovascular function, hydration status, mental status, nausea/vomiting and pain level     Patient is stable for discharge or admission: yes     Procedure completion:  Tolerated well, no immediate complications     Dorenda Gandy, MD 05/25/23 [phone]

## 2023-05-27 ENCOUNTER — Inpatient Hospital Stay: Attending: Hematology and Oncology

## 2023-05-28 ENCOUNTER — Other Ambulatory Visit (INDEPENDENT_AMBULATORY_CARE_PROVIDER_SITE_OTHER): Payer: Self-pay | Admitting: Nurse Practitioner

## 2023-05-29 ENCOUNTER — Telehealth: Payer: Self-pay | Admitting: Orthopaedic Surgery

## 2023-05-29 NOTE — Telephone Encounter (Signed)
 Patient called coming from the ER and needs an appointment for her right elbow for [facility].

## 2023-05-30 ENCOUNTER — Ambulatory Visit (HOSPITAL_BASED_OUTPATIENT_CLINIC_OR_DEPARTMENT_OTHER)

## 2023-05-30 ENCOUNTER — Other Ambulatory Visit (HOSPITAL_BASED_OUTPATIENT_CLINIC_OR_DEPARTMENT_OTHER): Payer: Self-pay

## 2023-05-30 ENCOUNTER — Encounter (HOSPITAL_BASED_OUTPATIENT_CLINIC_OR_DEPARTMENT_OTHER): Payer: Self-pay | Admitting: Student

## 2023-05-30 ENCOUNTER — Ambulatory Visit (HOSPITAL_BASED_OUTPATIENT_CLINIC_OR_DEPARTMENT_OTHER): Admitting: Student

## 2023-05-30 ENCOUNTER — Encounter: Payer: Self-pay | Admitting: Hematology and Oncology

## 2023-05-30 DIAGNOSIS — S53104A Unspecified dislocation of right ulnohumeral joint, initial encounter: Secondary | ICD-10-CM

## 2023-05-30 DIAGNOSIS — M25511 Pain in right shoulder: Secondary | ICD-10-CM

## 2023-05-30 MED ORDER — TRAMADOL HCL 50 MG PO TABS
50.0000 mg | ORAL_TABLET | Freq: Four times a day (QID) | ORAL | 0 refills | Status: AC | PRN
  Filled 2023-05-30: qty 20, 5d supply, fill #0
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
## 2023-06-03 ENCOUNTER — Inpatient Hospital Stay: Attending: Hematology and Oncology

## 2023-06-03 VITALS — BP 148/109 | HR 64 | Temp 98.2°F | Resp 14

## 2023-06-03 DIAGNOSIS — D509 Iron deficiency anemia, unspecified: Secondary | ICD-10-CM | POA: Insufficient documentation

## 2023-06-03 DIAGNOSIS — Z79899 Other long term (current) drug therapy: Secondary | ICD-10-CM | POA: Insufficient documentation

## 2023-06-03 MED ORDER — SODIUM CHLORIDE 0.9 % IV SOLN: INTRAVENOUS | Status: DC

## 2023-06-03 MED ORDER — SODIUM CHLORIDE 0.9 % IV SOLN
300.0000 mg | Freq: Once | INTRAVENOUS | Status: AC
  Administered 2023-06-03: 300.0065 mg via INTRAVENOUS
  Filled 2023-06-03: qty 300

## 2023-06-03 NOTE — Patient Instructions (Signed)
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
## 2023-06-07 ENCOUNTER — Ambulatory Visit (HOSPITAL_BASED_OUTPATIENT_CLINIC_OR_DEPARTMENT_OTHER): Payer: Self-pay

## 2023-06-10 ENCOUNTER — Telehealth: Payer: Self-pay | Admitting: *Deleted

## 2023-06-10 ENCOUNTER — Inpatient Hospital Stay

## 2023-06-10 ENCOUNTER — Ambulatory Visit

## 2023-06-10 VITALS — BP 141/72 | HR 63 | Temp 98.0°F | Resp 18

## 2023-06-10 DIAGNOSIS — D509 Iron deficiency anemia, unspecified: Secondary | ICD-10-CM

## 2023-06-10 MED ORDER — SODIUM CHLORIDE 0.9 % IV SOLN
300.0000 mg | Freq: Once | INTRAVENOUS | Status: AC
  Administered 2023-06-10: 300 mg via INTRAVENOUS
  Filled 2023-06-10: qty 300

## 2023-06-10 MED ORDER — SODIUM CHLORIDE 0.9 % IV SOLN: INTRAVENOUS | Status: DC

## 2023-06-10 NOTE — Patient Instructions (Signed)

## 2023-06-10 NOTE — Telephone Encounter (Addendum)
 Patient scheduled for IV Iron  infusion this AM at 0830 and had not arrived by 0858. Attempted to contact patient to assist with r/s appt. Mail Box full, unable to leave message. Addendum: Patient arrived late and [facility] infusion room was able to accommodate her at that time.

## 2023-06-13 ENCOUNTER — Ambulatory Visit: Attending: Physician Assistant | Admitting: Physical Therapy

## 2023-06-18 ENCOUNTER — Ambulatory Visit

## 2023-06-27 ENCOUNTER — Ambulatory Visit (HOSPITAL_BASED_OUTPATIENT_CLINIC_OR_DEPARTMENT_OTHER): Admitting: Student

## 2023-09-09 ENCOUNTER — Encounter: Payer: Self-pay | Admitting: Hematology and Oncology

## 2023-11-08 ENCOUNTER — Other Ambulatory Visit: Payer: Self-pay

## 2023-11-08 DIAGNOSIS — D509 Iron deficiency anemia, unspecified: Secondary | ICD-10-CM

## 2023-11-11 ENCOUNTER — Inpatient Hospital Stay: Admitting: Adult Health

## 2023-11-11 ENCOUNTER — Inpatient Hospital Stay

## 2023-11-12 ENCOUNTER — Ambulatory Visit: Admitting: Adult Health

## 2023-11-12 ENCOUNTER — Other Ambulatory Visit

## 2023-11-25 ENCOUNTER — Encounter: Payer: Self-pay | Admitting: Radiology
